# Patient Record
Sex: Female | Born: 1975 | Race: White | Hispanic: No | Marital: Single | Smoking: Current every day smoker
Health system: Southern US, Community
[De-identification: ages and names within clinical notes are randomized; demographics above are authoritative.]

## PROBLEM LIST (undated history)

## (undated) DIAGNOSIS — C50919 Malignant neoplasm of unspecified site of unspecified female breast: Secondary | ICD-10-CM

## (undated) DIAGNOSIS — M549 Dorsalgia, unspecified: Secondary | ICD-10-CM

## (undated) DIAGNOSIS — G8929 Other chronic pain: Secondary | ICD-10-CM

## (undated) HISTORY — PX: TONSILLECTOMY AND ADENOIDECTOMY: SHX28

## (undated) HISTORY — PX: MASTECTOMY: SHX3

---

## 2018-01-01 ENCOUNTER — Emergency Department (INDEPENDENT_AMBULATORY_CARE_PROVIDER_SITE_OTHER)
Admission: EM | Admit: 2018-01-01 | Discharge: 2018-01-01 | Disposition: A | Discharge status: Home / Self Care | Payer: Medicaid Other | Source: Home / Self Care | Attending: Family Medicine | Admitting: Family Medicine

## 2018-01-01 ENCOUNTER — Other Ambulatory Visit: Payer: Self-pay

## 2018-01-01 ENCOUNTER — Encounter: Payer: Self-pay | Admitting: *Deleted

## 2018-01-01 DIAGNOSIS — H6691 Otitis media, unspecified, right ear: Secondary | ICD-10-CM | POA: Diagnosis not present

## 2018-01-01 HISTORY — DX: Dorsalgia, unspecified: M54.9

## 2018-01-01 HISTORY — DX: Other chronic pain: G89.29

## 2018-01-01 MED ORDER — AMOXICILLIN-POT CLAVULANATE 875-125 MG PO TABS: 1.0000 | ORAL_TABLET | Freq: Two times a day (BID) | ORAL | 0 refills | Status: AC

## 2018-01-01 NOTE — Discharge Instructions (Signed)
° °  Please take antibiotics as prescribed and be sure to complete entire course even if you start to feel better to ensure infection does not come back.  Please call to schedule a follow up appointment with an ear nose and throat (ENT) specialist for recheck of Right ear symptoms next week.

## 2018-01-01 NOTE — ED Triage Notes (Signed)
Patient c/o chronic right ear infection with drainage. Also c/o HA and low grade fever last night. Reports seeing an ENT "years ago".

## 2018-01-01 NOTE — ED Provider Notes (Signed)
[name]    CSN: [id number] Arrival date & time: 01/01/18  0955     History   Chief Complaint Chief Complaint  Patient presents with  . Otalgia  . Headache    HPI Jaclyn Taylor is a 42 y.o. female.   HPI Jaclyn Taylor is a 42 y.o. female presenting to UC with c/o 2 months of waxing and waning Right ear pain that is aching and throbbing, associated brown discharge and Right sided headache. She has had recurrent Right ear infections for the last 5 years. Mild nasal congestion and cough from post-nasal drip.  She has not f/u with her PCP or ENT for current problem. She did have chills and a temp of 101*F last night.    Past Medical History:  Diagnosis Date  . Chronic back pain     There are no active problems to display for this patient.   Past Surgical History:  Procedure Laterality Date  . TONSILLECTOMY AND ADENOIDECTOMY      OB History   None      Home Medications    Prior to Admission medications   Medication Sig Start Date End Date Taking? Authorizing Provider  OXYCODONE-ACETAMINOPHEN PO Take 10 mg by mouth.   Yes [provider]  amoxicillin-clavulanate (AUGMENTIN) 875-125 MG tablet Take 1 tablet by mouth 2 (two) times daily for 10 days. 01/01/18 01/11/18  Noe Gens, PA-C    Family History Family History  Problem Relation Age of Onset  . Cancer Mother        breast and lung CA    Social History Social History   Tobacco Use  . Smoking status: Current Every Day Smoker  . Smokeless tobacco: Never Used  Substance Use Topics  . Alcohol use: Yes  . Drug use: Never     Allergies   Aleve [naproxen sodium]   Review of Systems Review of Systems  Constitutional: Positive for chills and fever.  HENT: Positive for congestion, ear discharge, ear pain (Right), postnasal drip and sinus pressure. Negative for rhinorrhea, sinus pain and sore throat.   Respiratory: Positive for cough. Negative for shortness of breath and  wheezing.   Gastrointestinal: Negative for diarrhea, nausea and vomiting.  Musculoskeletal: Negative for neck pain and neck stiffness.  Neurological: Positive for headaches. Negative for dizziness and light-headedness.     Physical Exam Triage Vital Signs ED Triage Vitals [01/01/18 1020]  Enc Vitals Group     BP 121/87     Pulse Rate (!) 108     Resp 14     Temp 98.7 F (37.1 C)     Temp Source Oral     SpO2 99 %     Weight 149 lb (67.6 kg)     Height      Head Circumference      Peak Flow      Pain Score 2     Pain Loc      Pain Edu?      Excl. in [place]?    No data found.  Updated Vital Signs BP 121/87 (BP Location: Right Arm)   Pulse (!) 108   Temp 98.7 F (37.1 C) (Oral)   Resp 14   Wt 149 lb (67.6 kg)   LMP 12/06/2017   SpO2 99%   Visual Acuity Right Eye Distance:   Left Eye Distance:   Bilateral Distance:    Right Eye Near:   Left Eye Near:    Bilateral Near:  Physical Exam  Constitutional: She is oriented to person, place, and time. She appears well-developed and well-nourished.  HENT:  Head: Normocephalic and atraumatic.  Right Ear: There is drainage (yellow/brown). Tympanic membrane is erythematous. Tympanic membrane is not bulging.  Left Ear: Tympanic membrane normal.  Nose: Right sinus exhibits maxillary sinus tenderness. Right sinus exhibits no frontal sinus tenderness. Left sinus exhibits no maxillary sinus tenderness and no frontal sinus tenderness.  Mouth/Throat: Uvula is midline, oropharynx is clear and moist and mucous membranes are normal.  Eyes: EOM are normal.  Neck: Normal range of motion. Neck supple.  Cardiovascular: Normal rate and regular rhythm.  Pulmonary/Chest: Effort normal and breath sounds normal. No respiratory distress. She has no wheezes.  Musculoskeletal: Normal range of motion.  Neurological: She is alert and oriented to person, place, and time.  Skin: Skin is warm and dry.  Psychiatric: She has a normal mood and  affect. Her behavior is normal.  Nursing note and vitals reviewed.    UC Treatments / Results  Labs (all labs ordered are listed, but only abnormal results are displayed) Labs Reviewed - No data to display  EKG None  Radiology No results found.  Procedures Procedures (including critical care time)  Medications Ordered in UC Medications - No data to display  Initial Impression / Assessment and Plan / UC Course  I have reviewed the triage vital signs and the nursing notes.  Pertinent labs & imaging results that were available during my care of the patient were reviewed by me and considered in my medical decision making (see chart for details).    Hx and exam c/w Right AOM Will start on 10 days of Augmentin Home care instructions and resource guide provided.  Final Clinical Impressions(s) / UC Diagnoses   Final diagnoses:  Recurrent acute otitis media of right ear     Discharge Instructions       Please take antibiotics as prescribed and be sure to complete entire course even if you start to feel better to ensure infection does not come back.  Please call to schedule a follow up appointment with an ear nose and throat (ENT) specialist for recheck of Right ear symptoms next week.     ED Prescriptions    Medication Sig Dispense Auth. Provider   amoxicillin-clavulanate (AUGMENTIN) 875-125 MG tablet Take 1 tablet by mouth 2 (two) times daily for 10 days. 20 tablet Noe Gens, PA-C     Controlled Substance Prescriptions [place] Controlled Substance Registry consulted? Not Applicable   Tyrell Antonio 01/01/18 1034

## 2018-04-20 ENCOUNTER — Other Ambulatory Visit: Payer: Self-pay

## 2018-04-20 ENCOUNTER — Emergency Department
Admission: EM | Admit: 2018-04-20 | Discharge: 2018-04-20 | Disposition: A | Discharge status: Home / Self Care | Payer: Medicaid Other | Source: Home / Self Care | Attending: Emergency Medicine | Admitting: Emergency Medicine

## 2018-04-20 DIAGNOSIS — H66014 Acute suppurative otitis media with spontaneous rupture of ear drum, recurrent, right ear: Secondary | ICD-10-CM

## 2018-04-20 LAB — POCT CBC W AUTO DIFF (K'VILLE URGENT CARE)

## 2018-04-20 MED ORDER — AMOXICILLIN-POT CLAVULANATE 875-125 MG PO TABS: 1.0000 | ORAL_TABLET | Freq: Two times a day (BID) | ORAL | 0 refills | Status: DC

## 2018-04-20 NOTE — ED Provider Notes (Signed)
[name]    CSN: [id number] Arrival date & time: 04/20/18  1038     History   Chief Complaint Chief Complaint  Patient presents with  . Otalgia    RT ear    HPI Jaclyn Taylor is a 43 y.o. female.  Patient presents with a throbbing pain in her right ear.  She has had a history of recurrent otitis media.  She was seen here 01/01/2018 and treated with Augmentin.  She has not seen an ear nose and throat specialist but has an appointment this week Thursday with [facility].  She was last treated with Augmentin and did have relief with that treatment program.  Her hearing is severely diminished in her right ear.  She has noticed swelling over the right mastoid area associated with a parietal type headache.  She states she has had a low-grade fever with this illness.  She also feels congested. HPI  Past Medical History:  Diagnosis Date  . Chronic back pain     There are no active problems to display for this patient.   Past Surgical History:  Procedure Laterality Date  . TONSILLECTOMY AND ADENOIDECTOMY      OB History   No obstetric history on file.      Home Medications    Prior to Admission medications   Medication Sig Start Date End Date Taking? Authorizing Provider  amoxicillin-clavulanate (AUGMENTIN) 875-125 MG tablet Take 1 tablet by mouth every 12 (twelve) hours. 04/20/18   Darlyne Russian, MD  OXYCODONE-ACETAMINOPHEN PO Take 10 mg by mouth.    [provider]    Family History Family History  Problem Relation Age of Onset  . Cancer Mother        breast and lung CA    Social History Social History   Tobacco Use  . Smoking status: Current Every Day Smoker  . Smokeless tobacco: Never Used  Substance Use Topics  . Alcohol use: Yes  . Drug use: Never     Allergies   Aleve [naproxen sodium] and Doxycycline   Review of Systems Review of Systems  Constitutional: Positive for fever.  HENT: Positive for congestion, ear discharge  and ear pain. Negative for sore throat.   Eyes: Negative.   Respiratory: Negative.   Cardiovascular: Negative.   Psychiatric/Behavioral: Negative.      Physical Exam Triage Vital Signs ED Triage Vitals  Enc Vitals Group     BP 04/20/18 1110 125/83     Pulse Rate 04/20/18 1110 89     Resp 04/20/18 1110 16     Temp 04/20/18 1110 98 F (36.7 C)     Temp Source 04/20/18 1110 Oral     SpO2 04/20/18 1110 100 %     Weight 04/20/18 1111 149 lb (67.6 kg)     Height 04/20/18 1111 5\' 2"  (1.575 m)     Head Circumference --      Peak Flow --      Pain Score 04/20/18 1110 8     Pain Loc --      Pain Edu? --      Excl. in [place]? --    No data found.  Updated Vital Signs BP 125/83 (BP Location: Right Arm)   Pulse 89   Temp 98 F (36.7 C) (Oral)   Resp 16   Ht 5\' 2"  (1.575 m)   Wt 67.6 kg   LMP  (LMP Unknown)   SpO2 100%   BMI 27.25 kg/m  Visual Acuity Right Eye Distance:   Left Eye Distance:   Bilateral Distance:    Right Eye Near:   Left Eye Near:    Bilateral Near:     Physical Exam Constitutional:      Appearance: Normal appearance.  HENT:     Head: Normocephalic.     Ears:     Comments: The left TM is normal the right TM is severely distorted with what appears to be a cholesteatoma.  There is a moist appearing exudate around the drum.  There is tenderness over the right mastoid and a 1 x 1 cm node over the mastoid.  The ear canal appears normal. Neck:     Musculoskeletal: Normal range of motion and neck supple.     Comments: There appears to be a right occipital node. Cardiovascular:     Rate and Rhythm: Normal rate and regular rhythm.  Pulmonary:     Effort: Pulmonary effort is normal.     Breath sounds: Normal breath sounds.  Neurological:     Mental Status: She is alert.    White blood count 10,400 77% granulocytes. Hemoglobin 14.3. Platelet count 241,000.  UC Treatments / Results  Labs (all labs ordered are listed, but only abnormal results are  displayed) Labs Reviewed  POCT CBC W AUTO DIFF ([place])    EKG None  Radiology No results found.  Procedures Procedures (including critical care time)  Medications Ordered in UC Medications - No data to display  Initial Impression / Assessment and Plan / UC Course  I have reviewed the triage vital signs and the nursing notes.  Pertinent labs & imaging results that were available during my care of the patient were reviewed by me and considered in my medical decision making (see chart for details). Patient has a purulent right otitis media.  I suspect there is a perforation that I could not see.  It looks like she may have cholesteatoma formation.  There is tenderness present over the mastoid area.  She is afebrile here today.  She does have an ENT appointment on Thursday.  She has responded well to Augmentin in the past and will go ahead and refill this medication and help her until she is able to be seen by ENT.  We discussed the possibility that infection could enter the mastoid sinus and she is aware of this being a significant infection.     Final Clinical Impressions(s) / UC Diagnoses   Final diagnoses:  Recurrent acute suppurative otitis media of right ear with spontaneous rupture of tympanic membrane     Discharge Instructions     Take antibiotics as instructed. You will need to use the pain medication you have at home for your back to use for pain. Be sure you keep your follow-up appointment with the ear nose and throat doctor. I am concerned you may have a cholesteatoma developing and it is very important that this be evaluated by the ear nose and throat physician.    ED Prescriptions    Medication Sig Dispense Auth. Provider   amoxicillin-clavulanate (AUGMENTIN) 875-125 MG tablet Take 1 tablet by mouth every 12 (twelve) hours. 14 tablet Darlyne Russian, MD     Controlled Substance Prescriptions [place] Controlled Substance Registry consulted? Yes, I have  consulted the [place] Controlled Substances Registry for this patient, and feel the risk/benefit ratio today is favorable for proceeding with this prescription for a controlled substance.   Darlyne Russian, MD 04/20/18 [phone]

## 2018-04-20 NOTE — Discharge Instructions (Signed)
Take antibiotics as instructed. You will need to use the pain medication you have at home for your back to use for pain. Be sure you keep your follow-up appointment with the ear nose and throat doctor. I am concerned you may have a cholesteatoma developing and it is very important that this be evaluated by the ear nose and throat physician.

## 2018-04-20 NOTE — ED Triage Notes (Signed)
Pt c/o RT ear pain. Was seen here a few months ago for same thing. Has appt with ENT next Thurs but can't wait. Pain 8/10.

## 2019-02-27 ENCOUNTER — Other Ambulatory Visit: Payer: Self-pay

## 2019-02-27 ENCOUNTER — Encounter: Payer: Self-pay | Admitting: Family Medicine

## 2019-02-27 ENCOUNTER — Emergency Department
Admission: EM | Admit: 2019-02-27 | Discharge: 2019-02-27 | Disposition: A | Discharge status: Home / Self Care | Payer: Medicaid Other | Source: Home / Self Care

## 2019-02-27 DIAGNOSIS — Z9189 Other specified personal risk factors, not elsewhere classified: Secondary | ICD-10-CM

## 2019-02-27 DIAGNOSIS — R519 Headache, unspecified: Secondary | ICD-10-CM

## 2019-02-27 MED ORDER — PREDNISONE 20 MG PO TABS: ORAL_TABLET | ORAL | 1 refills | Status: DC

## 2019-02-27 NOTE — ED Provider Notes (Signed)
[name]    CSN: [id number] Arrival date & time: 02/27/19  1123      History   Chief Complaint Chief Complaint  Patient presents with  . Headache    HPI Jaclyn Taylor is a 44 y.o. female.   [facility] patient.  Patient developed headache yesterday and has been on Goody powders for the last 24 hours.  She spiked a fever last night of 102.  She has had some intermittent epigastric pain, no significant cough, no loss of sense of smell, no diarrhea.     Past Medical History:  Diagnosis Date  . Chronic back pain     There are no problems to display for this patient.   Past Surgical History:  Procedure Laterality Date  . TONSILLECTOMY AND ADENOIDECTOMY      OB History   No obstetric history on file.      Home Medications    Prior to Admission medications   Medication Sig Start Date End Date Taking? Authorizing Provider  predniSONE (DELTASONE) 20 MG tablet 1 daily with food for headache 02/27/19   Robyn Haber, MD    Family History Family History  Problem Relation Age of Onset  . Cancer Mother        breast and lung CA  . Healthy Father     Social History Social History   Tobacco Use  . Smoking status: Current Every Day Smoker  . Smokeless tobacco: Never Used  Substance Use Topics  . Alcohol use: Yes  . Drug use: Never     Allergies   Aleve [naproxen sodium] and Doxycycline   Review of Systems Review of Systems  Constitutional: Positive for fatigue and fever.  Gastrointestinal: Positive for abdominal pain.  Musculoskeletal: Positive for myalgias.  Neurological: Positive for headaches.  All other systems reviewed and are negative.    Physical Exam Triage Vital Signs ED Triage Vitals  Enc Vitals Group     BP      Pulse      Resp      Temp      Temp src      SpO2      Weight      Height      Head Circumference      Peak Flow      Pain Score      Pain Loc      Pain Edu?      Excl. in [place]?    No data  found.  Updated Vital Signs BP 138/77 (BP Location: Right Arm)   Pulse (!) 104   Temp 98.2 F (36.8 C) (Oral)   Ht 5\' 2"  (1.575 m)   Wt 58.1 kg   SpO2 100%   BMI 23.41 kg/m    Physical Exam Vitals and nursing note reviewed.  Constitutional:      Appearance: She is well-developed and normal weight.  HENT:     Head: Normocephalic.  Eyes:     Extraocular Movements: Extraocular movements intact.  Cardiovascular:     Rate and Rhythm: Normal rate.  Pulmonary:     Effort: Pulmonary effort is normal.     Breath sounds: Normal breath sounds.  Abdominal:     General: Bowel sounds are normal. There is no distension.     Palpations: Abdomen is soft. There is no mass.     Tenderness: There is no abdominal tenderness.  Musculoskeletal:        General: Normal range of motion.  Cervical back: Normal range of motion and neck supple.  Skin:    General: Skin is warm and dry.  Neurological:     Mental Status: She is alert.     Cranial Nerves: No cranial nerve deficit or facial asymmetry.  Psychiatric:        Mood and Affect: Mood normal. Mood is not anxious.        Speech: Speech normal.        Behavior: Behavior normal.      UC Treatments / Results  Labs (all labs ordered are listed, but only abnormal results are displayed) Labs Reviewed  POC SARS CORONAVIRUS 2 AG -  ED    EKG   Radiology No results found.  Procedures Procedures (including critical care time)  Medications Ordered in UC Medications - No data to display  Initial Impression / Assessment and Plan / UC Course  I have reviewed the triage vital signs and the nursing notes.  Pertinent labs & imaging results that were available during my care of the patient were reviewed by me and considered in my medical decision making (see chart for details).     Final Clinical Impressions(s) / UC Diagnoses   Final diagnoses:  Acute intractable headache, unspecified headache type  At increased risk of exposure  to COVID-19 virus     Discharge Instructions     Strategies to prevent/treat Covid:  Vitamin D3 5000 IU (125 mg) daily Vitamin C 500 mg twice daily Zinc 50 to 75 mg daily  Listerine type mouthwash 4 times a day       ED Prescriptions    Medication Sig Dispense Auth. Provider   predniSONE (DELTASONE) 20 MG tablet 1 daily with food for headache 2 tablet Robyn Haber, MD     I have reviewed the PDMP during this encounter.   Robyn Haber, MD 02/27/19 [phone]

## 2019-02-27 NOTE — ED Triage Notes (Signed)
Headache, fever 102.2, chills, cough x 2 days, fatigue

## 2019-02-27 NOTE — Discharge Instructions (Addendum)
Strategies to prevent/treat Covid:  Vitamin D3 5000 IU (125 mg) daily Vitamin C 500 mg twice daily Zinc 50 to 75 mg daily  Listerine type mouthwash 4 times a day

## 2019-03-01 LAB — NOVEL CORONAVIRUS, NAA: SARS-CoV-2, NAA: NOT DETECTED

## 2019-06-27 ENCOUNTER — Emergency Department
Admission: EM | Admit: 2019-06-27 | Discharge: 2019-06-27 | Disposition: A | Discharge status: Home / Self Care | Payer: Medicaid Other | Source: Home / Self Care | Attending: Family Medicine | Admitting: Family Medicine

## 2019-06-27 ENCOUNTER — Other Ambulatory Visit: Payer: Self-pay

## 2019-06-27 ENCOUNTER — Encounter: Payer: Self-pay | Admitting: Emergency Medicine

## 2019-06-27 ENCOUNTER — Emergency Department (INDEPENDENT_AMBULATORY_CARE_PROVIDER_SITE_OTHER): Payer: Medicaid Other

## 2019-06-27 DIAGNOSIS — M4126 Other idiopathic scoliosis, lumbar region: Secondary | ICD-10-CM | POA: Diagnosis not present

## 2019-06-27 DIAGNOSIS — M545 Low back pain, unspecified: Secondary | ICD-10-CM

## 2019-06-27 DIAGNOSIS — M5136 Other intervertebral disc degeneration, lumbar region: Secondary | ICD-10-CM

## 2019-06-27 MED ORDER — KETOROLAC TROMETHAMINE 60 MG/2ML IM SOLN
60.0000 mg | Freq: Once | INTRAMUSCULAR | Status: AC
  Administered 2019-06-27: 60 mg via INTRAMUSCULAR

## 2019-06-27 MED ORDER — PREDNISONE 20 MG PO TABS: ORAL_TABLET | ORAL | 0 refills | Status: DC

## 2019-06-27 NOTE — ED Triage Notes (Signed)
Hx of chronic back pain - started yesterday w/ R hip pain  Woke up this am w/ lower back pain in the middle Difficult to stand upright  Drove self to hospital Advil last night - min relief No meds this am -has not eaten today Last visit was to [facility] for pain management and PT in Sept 2020

## 2019-06-27 NOTE — ED Provider Notes (Signed)
[name]    CSN: [id number] Arrival date & time: 06/27/19  1217      History   Chief Complaint Chief Complaint  Patient presents with  . Back Pain    lower    HPI Jaclyn Taylor is a 44 y.o. female.   Patient has a history of chronic lower back pain that has been recently quiescent.  She has been treated in the past by the [facility]. Yesterday she developed aching in her right hip area, and today awoke with more severe non-radiating pain in her lower middle and right back.  She recalls no injury or change in activities.  She denies bowel or bladder dysfunction, and no saddle numbness. Her pain is increased with standing, and improves when leaning forward.  Review of previous records reveals an LS spine x-ray done by [facility] 04/23/2011 that showed mild convex left scoliosis, retrolisthesis of L2 on L3, and disc space narrowing L2-3.  The history is provided by the patient.  Back Pain Location:  Lumbar spine and sacro-iliac joint Quality:  Aching Radiates to:  Does not radiate Pain severity:  Severe Pain is:  Same all the time Onset quality:  Sudden Duration:  1 day Timing:  Constant Progression:  Worsening Chronicity:  Recurrent Context: not lifting heavy objects and not recent injury   Relieved by:  Nothing Worsened by:  Movement and ambulation Ineffective treatments: warm packs. Associated symptoms: no abdominal pain, no abdominal swelling, no bladder incontinence, no bowel incontinence, no chest pain, no dysuria, no fever, no leg pain, no numbness, no paresthesias, no pelvic pain, no perianal numbness, no tingling and no weakness     Past Medical History:  Diagnosis Date  . Chronic back pain     There are no problems to display for this patient.   Past Surgical History:  Procedure Laterality Date  . TONSILLECTOMY AND ADENOIDECTOMY      OB History   No obstetric history on file.      Home Medications    Prior to Admission  medications   Medication Sig Start Date End Date Taking? Authorizing Provider  predniSONE (DELTASONE) 20 MG tablet Take one tab by mouth twice daily for 4 days, then one daily. Take with food. 06/27/19   Kandra Nicolas, MD    Family History Family History  Problem Relation Age of Onset  . Cancer Mother        breast and lung CA  . Healthy Father     Social History Social History   Tobacco Use  . Smoking status: Current Every Day Smoker  . Smokeless tobacco: Never Used  Substance Use Topics  . Alcohol use: Yes  . Drug use: Never     Allergies   Tizanidine, Aleve [naproxen sodium], Doxycycline, and Acetaminophen   Review of Systems Review of Systems  Constitutional: Negative for chills, diaphoresis, fatigue and fever.  Cardiovascular: Negative for chest pain.  Gastrointestinal: Negative for abdominal pain and bowel incontinence.  Genitourinary: Negative for bladder incontinence, dysuria and pelvic pain.  Musculoskeletal: Positive for back pain.  Skin: Negative for rash.  Neurological: Negative for tingling, weakness, numbness and paresthesias.  All other systems reviewed and are negative.    Physical Exam Triage Vital Signs ED Triage Vitals  Enc Vitals Group     BP 06/27/19 1232 (!) 143/83     Pulse Rate 06/27/19 1232 (!) 101     Resp 06/27/19 1232 20     Temp 06/27/19 1232  98.2 F (36.8 C)     Temp Source 06/27/19 1232 Oral     SpO2 06/27/19 1232 100 %     Weight 06/27/19 1234 137 lb (62.1 kg)     Height 06/27/19 1234 5\' 2"  (1.575 m)     Head Circumference --      Peak Flow --      Pain Score 06/27/19 1233 9     Pain Loc --      Pain Edu? --      Excl. in [place]? --    No data found.  Updated Vital Signs BP (!) 143/83 (BP Location: Right Arm)   Pulse (!) 101   Temp 98.2 F (36.8 C) (Oral)   Resp 20   Ht 5\' 2"  (1.575 m)   Wt 62.1 kg   LMP 06/05/2019 (Exact Date)   SpO2 100%   BMI 25.06 kg/m   Visual Acuity Right Eye Distance:   Left Eye  Distance:   Bilateral Distance:    Right Eye Near:   Left Eye Near:    Bilateral Near:     Physical Exam Vitals and nursing note reviewed.  Constitutional:      General: She is not in acute distress.    Comments: Patient is uncomfortable with any movement.  Eyes:     Pupils: Pupils are equal, round, and reactive to light.  Cardiovascular:     Rate and Rhythm: Tachycardia present.     Heart sounds: Normal heart sounds.  Pulmonary:     Breath sounds: Normal breath sounds.  Abdominal:     General: Abdomen is flat.     Palpations: Abdomen is soft.     Tenderness: There is no abdominal tenderness.  Musculoskeletal:     Cervical back: Normal range of motion.     Lumbar back: Tenderness present. No swelling. Decreased range of motion. Positive left straight leg raise test. Negative right straight leg raise test.       Back:     Comments: Back:  Range of motion is decreased.  Walking appears uncomfortable for patient.  She is unable to squat and heel/toe walk. Tenderness in the midline and bilateral paraspinous muscles from L3 to Sacral area, worse on the right.  Straight leg raising test is positive on the right.  Sitting knee extension test is positive on the right.  Strength and sensation in the lower extremities is normal.  Patellar and achilles reflexes are hyperreflexic.   Skin:    General: Skin is warm and dry.     Findings: No rash.  Neurological:     Mental Status: She is alert.      UC Treatments / Results  Labs (all labs ordered are listed, but only abnormal results are displayed) Labs Reviewed - No data to display  EKG   Radiology DG Lumbar Spine Complete  Result Date: 06/27/2019 CLINICAL DATA:  Chronic low back pain.  Pain is suddenly worse. EXAM: LUMBAR SPINE - COMPLETE 4+ VIEW COMPARISON:  None. FINDINGS: Mild rotary levoscoliosis in the lumbar spine at L2-L3. Mild retrolisthesis at L2-L3 and L3-L4. Mild disc space narrowing at L5-S1. The vertebral body heights  are maintained. Degenerate endplate changes at 624THL. Normal appearance of the SI joints. IMPRESSION: Mild scoliosis with degenerative changes in lumbar spine. Electronically Signed   By: Markus Daft M.D.   On: 06/27/2019 13:58    Procedures Procedures (including critical care time)  Medications Ordered in UC Medications  ketorolac (TORADOL) injection 60 mg (  60 mg Intramuscular Given 06/27/19 1320)    Initial Impression / Assessment and Plan / UC Course  I have reviewed the triage vital signs and the nursing notes.  Pertinent labs & imaging results that were available during my care of the patient were reviewed by me and considered in my medical decision making (see chart for details).    Administered Toradol 60mg  IM  Begin prednisone burst/taper. Followup with Dr. Aundria Mems ([facility]) if not improving about two weeks.    Final Clinical Impressions(s) / UC Diagnoses   Final diagnoses:  Degenerative disc disease, lumbar  Acute right-sided low back pain without sciatica     Discharge Instructions     Apply ice pack for 20 to 30 minutes, 3 to 4 times daily  Continue until pain decreases.  Begin back range of motion and stretching exercises as tolerated.    ED Prescriptions    Medication Sig Dispense Auth. Provider   predniSONE (DELTASONE) 20 MG tablet Take one tab by mouth twice daily for 4 days, then one daily. Take with food. 12 tablet Kandra Nicolas, MD        Kandra Nicolas, MD 06/27/19 [phone]

## 2019-06-27 NOTE — Discharge Instructions (Addendum)
Apply ice pack for 20 to 30 minutes, 3 to 4 times daily  Continue until pain decreases.  Begin back range of motion and stretching exercises as tolerated.

## 2020-01-02 ENCOUNTER — Other Ambulatory Visit: Payer: Self-pay

## 2020-01-02 ENCOUNTER — Emergency Department (INDEPENDENT_AMBULATORY_CARE_PROVIDER_SITE_OTHER)
Admission: RE | Admit: 2020-01-02 | Discharge: 2020-01-02 | Disposition: A | Discharge status: Home / Self Care | Payer: Medicaid Other | Source: Ambulatory Visit

## 2020-01-02 VITALS — BP 127/82 | HR 100 | Temp 98.7°F | Resp 18

## 2020-01-02 DIAGNOSIS — J069 Acute upper respiratory infection, unspecified: Secondary | ICD-10-CM | POA: Diagnosis not present

## 2020-01-02 DIAGNOSIS — J019 Acute sinusitis, unspecified: Secondary | ICD-10-CM

## 2020-01-02 DIAGNOSIS — B9689 Other specified bacterial agents as the cause of diseases classified elsewhere: Secondary | ICD-10-CM

## 2020-01-02 DIAGNOSIS — J209 Acute bronchitis, unspecified: Secondary | ICD-10-CM

## 2020-01-02 LAB — POC SARS CORONAVIRUS 2 AG -  ED: SARS Coronavirus 2 Ag: NEGATIVE

## 2020-01-02 MED ORDER — IPRATROPIUM BROMIDE 0.06 % NA SOLN: 2.0000 | Freq: Four times a day (QID) | NASAL | 1 refills | Status: DC

## 2020-01-02 MED ORDER — ALBUTEROL SULFATE HFA 108 (90 BASE) MCG/ACT IN AERS: 1.0000 | INHALATION_SPRAY | Freq: Four times a day (QID) | RESPIRATORY_TRACT | 0 refills | Status: DC | PRN

## 2020-01-02 MED ORDER — AMOXICILLIN-POT CLAVULANATE 875-125 MG PO TABS: 1.0000 | ORAL_TABLET | Freq: Two times a day (BID) | ORAL | 0 refills | Status: DC

## 2020-01-02 MED ORDER — PREDNISONE 20 MG PO TABS: ORAL_TABLET | ORAL | 0 refills | Status: DC

## 2020-01-02 NOTE — ED Triage Notes (Signed)
Pt c/o runny nose since last weekend. Neg covid test on Monday. Sxs worsening over the week. Chest and head congestion/headache. Fever 101.2 last night. Mucinex cold and flu this am. No known covid exposure. Has not had covid vaccinations.

## 2020-01-02 NOTE — Discharge Instructions (Signed)

## 2020-01-02 NOTE — ED Provider Notes (Signed)
[name]    CSN: [id number] Arrival date & time: 01/02/20  1145      History   Chief Complaint Chief Complaint  Patient presents with  . Cough    Appt 12pm  . Nasal Congestion  . Headache    HPI Jaclyn Taylor is a 44 y.o. female.   HPI  Jaclyn Taylor is a 44 y.o. female presenting to UC with c/o runny nose that started last week. She had a negative COVID test 3 days ago but has since developed worsening cough, congestion, sore throat, generalized HA, decreased taste and smell and temp of 101.2*F last night. She has been taking Mucinex cold and flu, last dose this morning. No known COVID exposure. She has not received the COVID vaccine.  Hx of sinusitis and pneumonia in the past. She has to be hospitalized for pneumonia. No hx of asthma.    Past Medical History:  Diagnosis Date  . Chronic back pain     There are no problems to display for this patient.   Past Surgical History:  Procedure Laterality Date  . TONSILLECTOMY AND ADENOIDECTOMY      OB History   No obstetric history on file.      Home Medications    Prior to Admission medications   Medication Sig Start Date End Date Taking? Authorizing Provider  albuterol (VENTOLIN HFA) 108 (90 Base) MCG/ACT inhaler Inhale 1-2 puffs into the lungs every 6 (six) hours as needed for wheezing or shortness of breath. 01/02/20   Noe Gens, PA-C  amoxicillin-clavulanate (AUGMENTIN) 875-125 MG tablet Take 1 tablet by mouth 2 (two) times daily. One po bid x 7 days 01/02/20   Noe Gens, PA-C  ipratropium (ATROVENT) 0.06 % nasal spray Place 2 sprays into both nostrils 4 (four) times daily. 01/02/20   Noe Gens, PA-C  predniSONE (DELTASONE) 20 MG tablet 3 tabs po day one, then 2 po daily x 4 days 01/02/20   Noe Gens, PA-C    Family History Family History  Problem Relation Age of Onset  . Cancer Mother        breast and lung CA  . Healthy Father     Social History Social History    Tobacco Use  . Smoking status: Current Every Day Smoker  . Smokeless tobacco: Never Used  Vaping Use  . Vaping Use: Never used  Substance Use Topics  . Alcohol use: Yes  . Drug use: Never     Allergies   Tizanidine, Aleve [naproxen sodium], Doxycycline, and Acetaminophen   Review of Systems Review of Systems  Constitutional: Positive for fever. Negative for chills.  HENT: Positive for congestion, sinus pressure and sore throat. Negative for ear pain, trouble swallowing and voice change.   Respiratory: Positive for cough. Negative for shortness of breath.   Cardiovascular: Negative for chest pain and palpitations.  Gastrointestinal: Negative for abdominal pain, diarrhea, nausea and vomiting.  Musculoskeletal: Negative for arthralgias, back pain and myalgias.  Skin: Negative for rash.  Neurological: Positive for headaches. Negative for dizziness and light-headedness.  All other systems reviewed and are negative.    Physical Exam Triage Vital Signs ED Triage Vitals  Enc Vitals Group     BP 01/02/20 1155 127/82     Pulse Rate 01/02/20 1155 100     Resp 01/02/20 1155 18     Temp 01/02/20 1155 98.7 F (37.1 C)     Temp Source 01/02/20 1155 Oral  SpO2 01/02/20 1155 100 %     Weight --      Height --      Head Circumference --      Peak Flow --      Pain Score 01/02/20 1156 0     Pain Loc --      Pain Edu? --      Excl. in [place]? --    No data found.  Updated Vital Signs BP 127/82 (BP Location: Right Arm)   Pulse 100   Temp 98.7 F (37.1 C) (Oral)   Resp 18   LMP  (LMP Unknown)   SpO2 100%   Visual Acuity Right Eye Distance:   Left Eye Distance:   Bilateral Distance:    Right Eye Near:   Left Eye Near:    Bilateral Near:     Physical Exam Vitals and nursing note reviewed.  Constitutional:      General: She is not in acute distress.    Appearance: She is well-developed. She is not ill-appearing, toxic-appearing or diaphoretic.  HENT:     Head:  Normocephalic and atraumatic.     Right Ear: Tympanic membrane and ear canal normal.     Left Ear: Tympanic membrane and ear canal normal.     Nose:     Right Sinus: Maxillary sinus tenderness and frontal sinus tenderness present.     Left Sinus: Maxillary sinus tenderness and frontal sinus tenderness present.     Mouth/Throat:     Lips: Pink.     Mouth: Mucous membranes are moist.     Pharynx: Oropharynx is clear. Uvula midline.  Cardiovascular:     Rate and Rhythm: Normal rate and regular rhythm.  Pulmonary:     Effort: Pulmonary effort is normal. No respiratory distress.     Breath sounds: No stridor. Wheezing and rhonchi present. No rales.  Musculoskeletal:        General: Normal range of motion.     Cervical back: Normal range of motion and neck supple. No rigidity.  Lymphadenopathy:     Cervical: No cervical adenopathy.  Skin:    General: Skin is warm and dry.  Neurological:     Mental Status: She is alert and oriented to person, place, and time.  Psychiatric:        Behavior: Behavior normal.      UC Treatments / Results  Labs (all labs ordered are listed, but only abnormal results are displayed) Labs Reviewed  COVID-19, FLU A+B AND RSV  POC SARS CORONAVIRUS 2 AG -  ED    EKG   Radiology No results found.  Procedures Procedures (including critical care time)  Medications Ordered in UC Medications - No data to display  Initial Impression / Assessment and Plan / UC Course  I have reviewed the triage vital signs and the nursing notes.  Pertinent labs & imaging results that were available during my care of the patient were reviewed by me and considered in my medical decision making (see chart for details).     Hx and exam c/w sinusitis and bronchitis Discussed CXR, pt would like to try trial of antibiotics first, does not feel like she did when she needed to be hospitalized for pneumonia in the past.  Will tx with Augmentin, prednisone and  albuterol COVID/RSV/Flu test pending Discussed symptoms that warrant emergent care in the ED. AVS given  Final Clinical Impressions(s) / UC Diagnoses   Final diagnoses:  Acute upper respiratory infection  Acute  bronchitis, unspecified organism  Acute bacterial rhinosinusitis     Discharge Instructions      Please take antibiotics as prescribed and be sure to complete entire course even if you start to feel better to ensure infection does not come back.  You may take 500mg  acetaminophen every 4-6 hours or in combination with ibuprofen 400-600mg  every 6-8 hours as needed for pain, inflammation, and fever.  Be sure to well hydrated with clear liquids and get at least 8 hours of sleep at night, preferably more while sick.   Please follow up with family medicine in 1 week if needed.     ED Prescriptions    Medication Sig Dispense Auth. Provider   amoxicillin-clavulanate (AUGMENTIN) 875-125 MG tablet Take 1 tablet by mouth 2 (two) times daily. One po bid x 7 days 14 tablet Yana Schorr O, PA-C   predniSONE (DELTASONE) 20 MG tablet 3 tabs po day one, then 2 po daily x 4 days 11 tablet Haidy Kackley O, PA-C   albuterol (VENTOLIN HFA) 108 (90 Base) MCG/ACT inhaler Inhale 1-2 puffs into the lungs every 6 (six) hours as needed for wheezing or shortness of breath. 18 g Leeroy Cha O, PA-C   ipratropium (ATROVENT) 0.06 % nasal spray Place 2 sprays into both nostrils 4 (four) times daily. 15 mL Noe Gens, PA-C     PDMP not reviewed this encounter.   Noe Gens, [place] 01/02/20 1502

## 2020-01-03 ENCOUNTER — Telehealth: Payer: Self-pay | Admitting: Family Medicine

## 2020-01-03 MED ORDER — BENZONATATE 200 MG PO CAPS: 200.0000 mg | ORAL_CAPSULE | Freq: Three times a day (TID) | ORAL | 0 refills | Status: DC | PRN

## 2020-01-03 NOTE — Telephone Encounter (Signed)
Patient requests cough medication. Recommend Mucinex (plain guafenesin 1200mg ), one twice daily with plenty of fluids. Rx [employer]

## 2020-01-03 NOTE — Telephone Encounter (Signed)
Pt notified tessalon rx was sent in and also advised mucinex BID. F/u with PCP next week if not improving.

## 2020-01-07 ENCOUNTER — Telehealth: Payer: Self-pay

## 2020-01-07 NOTE — Telephone Encounter (Signed)
Pt called yesterday asking for lab results, had to call lab due to results not crossing over. Per lab, all results neg. Pt notified.

## 2020-02-22 DIAGNOSIS — U071 COVID-19: Secondary | ICD-10-CM

## 2020-02-22 HISTORY — DX: COVID-19: U07.1

## 2020-08-20 ENCOUNTER — Encounter: Payer: Self-pay | Admitting: Emergency Medicine

## 2020-08-20 ENCOUNTER — Emergency Department (INDEPENDENT_AMBULATORY_CARE_PROVIDER_SITE_OTHER)
Admission: EM | Admit: 2020-08-20 | Discharge: 2020-08-20 | Disposition: A | Discharge status: Home / Self Care | Payer: Medicaid Other | Source: Home / Self Care

## 2020-08-20 ENCOUNTER — Telehealth: Payer: Self-pay | Admitting: Emergency Medicine

## 2020-08-20 ENCOUNTER — Other Ambulatory Visit: Payer: Self-pay

## 2020-08-20 DIAGNOSIS — M7711 Lateral epicondylitis, right elbow: Secondary | ICD-10-CM

## 2020-08-20 DIAGNOSIS — M25521 Pain in right elbow: Secondary | ICD-10-CM

## 2020-08-20 HISTORY — DX: Malignant neoplasm of unspecified site of unspecified female breast: C50.919

## 2020-08-20 MED ORDER — METHYLPREDNISOLONE 4 MG PO TBPK: ORAL_TABLET | ORAL | 0 refills | Status: DC

## 2020-08-20 MED ORDER — METHYLPREDNISOLONE ACETATE 80 MG/ML IJ SUSP
80.0000 mg | Freq: Once | INTRAMUSCULAR | Status: AC
  Administered 2020-08-20: 80 mg via INTRAMUSCULAR

## 2020-08-20 NOTE — ED Triage Notes (Signed)
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

## 2020-08-20 NOTE — Telephone Encounter (Deleted)
Pt requested diflucan be sent to pharmacy - the last time she was on antibiotics, she had to take diflucan - Provider updated  Kathi Ludwig, FNP) - diflucan sent in

## 2020-08-20 NOTE — Telephone Encounter (Signed)
Error wrong chart

## 2020-08-20 NOTE — ED Provider Notes (Signed)
[name]    CSN: [id number] Arrival date & time: 08/20/20  4854      History   Chief Complaint Chief Complaint  Patient presents with   Elbow Pain    right    HPI Jaclyn Taylor is a 45 y.o. female.   HPI 45 year old female presents with right elbow pain for 2 weeks.  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  Reports taking OTC Tylenol, heat, ice, ibuprofen with minimal relief.  Patient is not vaccinated for COVID-19.  Past Medical History:  Diagnosis Date   Breast cancer ([place])    Chronic back pain    COVID-19 02/2020    There are no problems to display for this patient.   Past Surgical History:  Procedure Laterality Date   MASTECTOMY     TONSILLECTOMY AND ADENOIDECTOMY      OB History   No obstetric history on file.      Home Medications    Prior to Admission medications   Medication Sig Start Date End Date Taking? Authorizing Provider  methylPREDNISolone (MEDROL DOSEPAK) 4 MG TBPK tablet Take as directed 08/20/20  Yes Eliezer Lofts, FNP  albuterol (VENTOLIN HFA) 108 (90 Base) MCG/ACT inhaler Inhale 1-2 puffs into the lungs every 6 (six) hours as needed for wheezing or shortness of breath. Patient not taking: Reported on 08/20/2020 01/02/20   Noe Gens, PA-C  amoxicillin-clavulanate (AUGMENTIN) 875-125 MG tablet Take 1 tablet by mouth 2 (two) times daily. One po bid x 7 days Patient not taking: Reported on 08/20/2020 01/02/20   Noe Gens, PA-C  benzonatate (TESSALON) 200 MG capsule Take 1 capsule (200 mg total) by mouth 3 (three) times daily as needed for cough. Patient not taking: Reported on 08/20/2020 01/03/20   Kandra Nicolas, MD  ipratropium (ATROVENT) 0.06 % nasal spray Place 2 sprays into both nostrils 4 (four) times daily. Patient not taking: Reported on 08/20/2020 01/02/20   Noe Gens, PA-C  predniSONE (DELTASONE) 20 MG tablet 3 tabs po day one, then 2 po daily x 4  days Patient not taking: Reported on 08/20/2020 01/02/20   Noe Gens, PA-C    Family History Family History  Problem Relation Age of Onset   Cancer Mother        breast and lung CA   Healthy Father     Social History Social History   Tobacco Use   Smoking status: Every Day    Pack years: 0.00   Smokeless tobacco: Never  Vaping Use   Vaping Use: Never used  Substance Use Topics   Alcohol use: Not Currently   Drug use: Never     Allergies   Tizanidine, Aleve [naproxen sodium], Doxycycline, and Acetaminophen   Review of Systems Review of Systems  Musculoskeletal:        Right elbow pain x2 weeks    Physical Exam Triage Vital Signs ED Triage Vitals  Enc Vitals Group     BP 08/20/20 0953 132/88     Pulse Rate 08/20/20 0953 99     Resp 08/20/20 0953 17     Temp 08/20/20 0953 98.9 F (37.2 C)     Temp Source 08/20/20 0953 Oral     SpO2 08/20/20 0953 98 %     Weight --      Height --      Head Circumference --      Peak Flow --  Pain Score 08/20/20 0954 9     Pain Loc --      Pain Edu? --      Excl. in [place]? --    No data found.  Updated Vital Signs BP 132/88 (BP Location: Right Arm)   Pulse 99   Temp 98.9 F (37.2 C) (Oral)   Resp 17   LMP 08/14/2020 (Exact Date)   SpO2 98%    Physical Exam Vitals and nursing note reviewed.  Constitutional:      Appearance: Normal appearance. She is normal weight.  HENT:     Head: Normocephalic and atraumatic.     Mouth/Throat:     Mouth: Mucous membranes are moist.     Pharynx: Oropharynx is clear.  Eyes:     Extraocular Movements: Extraocular movements intact.     Conjunctiva/sclera: Conjunctivae normal.     Pupils: Pupils are equal, round, and reactive to light.  Cardiovascular:     Rate and Rhythm: Normal rate and regular rhythm.     Pulses: Normal pulses.     Heart sounds: Normal heart sounds.  Pulmonary:     Effort: Pulmonary effort is normal. No respiratory distress.     Breath sounds:  Normal breath sounds. No wheezing, rhonchi or rales.  Musculoskeletal:     Cervical back: Normal range of motion and neck supple.     Comments: Right elbow: TTP over superior/lateral aspect of olecranon, mild soft tissue swelling, LROM and pain elicited with flexion/extension, pronation/supination  Skin:    General: Skin is warm and dry.  Neurological:     General: No focal deficit present.     Mental Status: She is alert and oriented to person, place, and time.  Psychiatric:        Mood and Affect: Mood normal.        Behavior: Behavior normal.     UC Treatments / Results  Labs (all labs ordered are listed, but only abnormal results are displayed) Labs Reviewed - No data to display  EKG   Radiology No results found.  Procedures Procedures (including critical care time)  Medications Ordered in UC Medications  methylPREDNISolone acetate (DEPO-MEDROL) injection 80 mg (80 mg Intramuscular Given 08/20/20 1025)    Initial Impression / Assessment and Plan / UC Course  I have reviewed the triage vital signs and the nursing notes.  Pertinent labs & imaging results that were available during my care of the patient were reviewed by me and considered in my medical decision making (see chart for details).     MDM: 1.  Right elbow pain-Rx Medrol Dosepak, 2.  Lateral epicondylitis elbow-Depo-Medrol IM now, right arm band placed prior to discharge. Final Clinical Impressions(s) / UC Diagnoses   Final diagnoses:  Right elbow pain  Lateral epicondylitis, right elbow     Discharge Instructions      Advised/instructed patient not to start Medrol Dosepak until 130-2 PM today.  Advised patient to take medication as directed with food to completion.  Encourage patient increase daily water intake.  Advised patient to wear right arm band 24/7 for the next 2 weeks except when bathing.  Work note provided to patient and advised to avoid mild-moderately heavy lifting, repetitive/overuse  motions involving right elbow for the next 2 weeks.     ED Prescriptions     Medication Sig Dispense Auth. Provider   methylPREDNISolone (MEDROL DOSEPAK) 4 MG TBPK tablet Take as directed 1 each Eliezer Lofts, FNP      PDMP not  reviewed this encounter.   Eliezer Lofts, [place] 08/20/20 1046

## 2020-08-20 NOTE — Discharge Instructions (Addendum)
Advised/instructed patient not to start Medrol Dosepak until 130-2 PM today.  Advised patient to take medication as directed with food to completion.  Encourage patient increase daily water intake.  Advised patient to wear right arm band 24/7 for the next 2 weeks except when bathing.  Work note provided to patient and advised to avoid mild-moderately heavy lifting, repetitive/overuse motions involving right elbow for the next 2 weeks.

## 2020-09-14 ENCOUNTER — Other Ambulatory Visit: Payer: Self-pay

## 2020-09-14 ENCOUNTER — Encounter: Payer: Self-pay | Admitting: Emergency Medicine

## 2020-09-14 ENCOUNTER — Emergency Department (INDEPENDENT_AMBULATORY_CARE_PROVIDER_SITE_OTHER)
Admission: EM | Admit: 2020-09-14 | Discharge: 2020-09-14 | Disposition: A | Discharge status: Home / Self Care | Payer: Medicaid Other | Source: Home / Self Care

## 2020-09-14 DIAGNOSIS — M25521 Pain in right elbow: Secondary | ICD-10-CM

## 2020-09-14 DIAGNOSIS — M659 Synovitis and tenosynovitis, unspecified: Secondary | ICD-10-CM

## 2020-09-14 DIAGNOSIS — M65931 Unspecified synovitis and tenosynovitis, right forearm: Secondary | ICD-10-CM

## 2020-09-14 MED ORDER — PREDNISONE 20 MG PO TABS: ORAL_TABLET | ORAL | 0 refills | Status: DC

## 2020-09-14 NOTE — ED Triage Notes (Signed)
Patient here for recurrent right elbow pain.  Patient was seen here about a month ago, diagnosed with tennis elbow, wearing the band given to patient, hasn't helped a lot.  Patient states that pain is shooting up her arm.  Patient is taken Ibuprofen for pain and Meloxicam.

## 2020-09-14 NOTE — Discharge Instructions (Addendum)
Advised patient to take medication as directed with food to completion.  Encourage patient to wear right wrist brace 24/7 except when bathing) until being evaluated by orthopedic provider (contact information provided above).

## 2020-09-14 NOTE — ED Provider Notes (Signed)
Jaclyn Taylor CARE    CSN: [id number] Arrival date & time: 09/14/20  1107      History   Chief Complaint Chief Complaint  Patient presents with   Elbow Pain    HPI Jaclyn Taylor is a 45 y.o. female.   HPI 45 year old female presents with right elbow pain for 1 month diagnosed with tennis elbow/lateral epicondylitis (patient was evaluated by me for lateral epicondylitis prescribed Medrol dose pack and right elbow brace at that time) wearing armband given then which has not helped a lot.  Reports patient taking ibuprofen for pain with meloxicam.  Patient reports not following up with PCP and/or orthopedic provider since that evaluation  Past Medical History:  Diagnosis Date   Breast cancer ([place])    Chronic back pain    COVID-19 02/2020    There are no problems to display for this patient.   Past Surgical History:  Procedure Laterality Date   MASTECTOMY     TONSILLECTOMY AND ADENOIDECTOMY      OB History   No obstetric history on file.      Home Medications    Prior to Admission medications   Medication Sig Start Date End Date Taking? Authorizing Provider  predniSONE (DELTASONE) 20 MG tablet Take 3 tabs PO daily x 3 days, then 2 tabs PO daily x 3 days, then 1 tab PO daily x 3 days 09/14/20  Yes Jaclyn Lofts, FNP  albuterol (VENTOLIN HFA) 108 (90 Base) MCG/ACT inhaler Inhale 1-2 puffs into the lungs every 6 (six) hours as needed for wheezing or shortness of breath. Patient not taking: Reported on 08/20/2020 01/02/20   Jaclyn Gens, PA-C  amoxicillin-clavulanate (AUGMENTIN) 875-125 MG tablet Take 1 tablet by mouth 2 (two) times daily. One po bid x 7 days Patient not taking: Reported on 08/20/2020 01/02/20   Jaclyn Gens, PA-C  benzonatate (TESSALON) 200 MG capsule Take 1 capsule (200 mg total) by mouth 3 (three) times daily as needed for cough. Patient not taking: Reported on 08/20/2020 01/03/20   Jaclyn Nicolas, MD  ipratropium (ATROVENT) 0.06 % nasal spray  Place 2 sprays into both nostrils 4 (four) times daily. Patient not taking: Reported on 08/20/2020 01/02/20   Jaclyn Taylor    Family History Family History  Problem Relation Age of Onset   Cancer Mother        breast and lung CA   Healthy Father     Social History Social History   Tobacco Use   Smoking status: Every Day   Smokeless tobacco: Never  Vaping Use   Vaping Use: Never used  Substance Use Topics   Alcohol use: Not Currently   Drug use: Never     Allergies   Tizanidine, Aleve [naproxen sodium], Doxycycline, and Acetaminophen   Review of Systems Review of Systems  Musculoskeletal:        Left elbow pain, wrist pain x1 month    Physical Exam Triage Vital Signs ED Triage Vitals  Enc Vitals Group     BP 09/14/20 1149 126/88     Pulse Rate 09/14/20 1149 80     Resp --      Temp 09/14/20 1149 98.3 F (36.8 C)     Temp Source 09/14/20 1149 Oral     SpO2 09/14/20 1149 97 %     Weight 09/14/20 1150 145 lb (65.8 kg)     Height 09/14/20 1150 '5\' 2"'$  (1.575 m)     Head Circumference --  Peak Flow --      Pain Score 09/14/20 1150 8     Pain Loc --      Pain Edu? --      Excl. in [place]? --    No data found.  Updated Vital Signs BP 126/88 (BP Location: Left Arm)   Pulse 80   Temp 98.3 F (36.8 C) (Oral)   Ht '5\' 2"'$  (1.575 m)   Wt 145 lb (65.8 kg)   LMP 09/08/2020   SpO2 97%   BMI 26.52 kg/m       Physical Exam Vitals and nursing note reviewed.  Constitutional:      General: She is not in acute distress.    Appearance: Normal appearance. She is normal weight. She is not ill-appearing.  HENT:     Head: Normocephalic and atraumatic.     Mouth/Throat:     Mouth: Mucous membranes are moist.     Pharynx: Oropharynx is clear.  Eyes:     Extraocular Movements: Extraocular movements intact.     Conjunctiva/sclera: Conjunctivae normal.     Pupils: Pupils are equal, round, and reactive to light.  Cardiovascular:     Rate and Rhythm: Normal  rate and regular rhythm.     Pulses: Normal pulses.     Heart sounds: Normal heart sounds.  Pulmonary:     Effort: Pulmonary effort is normal. No respiratory distress.     Breath sounds: Normal breath sounds. No wheezing, rhonchi or rales.  Musculoskeletal:     Cervical back: Normal range of motion and neck supple. No rigidity or tenderness.     Comments: Right elbow: TTP over lateral olecranon, minimal soft tissue swelling, FROM with flexion/extension, pronation/supination.   Right wrist: Unable to perform [facility] test, positive ulnar deviation, positive Tinel sign, no deformity noted  Skin:    General: Skin is warm and dry.  Neurological:     General: No focal deficit present.     Mental Status: She is alert and oriented to person, place, and time.  Psychiatric:        Mood and Affect: Mood normal.        Behavior: Behavior normal.     UC Treatments / Results  Labs (all labs ordered are listed, but only abnormal results are displayed) Labs Reviewed - No data to display  EKG   Radiology No results found.  Procedures Procedures (including critical care time)  Medications Ordered in UC Medications - No data to display  Initial Impression / Assessment and Plan / UC Course  I have reviewed the triage vital signs and the nursing notes.  Pertinent labs & imaging results that were available during my care of the patient were reviewed by me and considered in my medical decision making (see chart for details).    MDM: 1.  Right elbow pain-advised patient to continue right elbow sleeve for right lateral epicondylitis, 2.  Tenosynovitis of right wrist-advised patient to wear right wrist brace/thumb spica 24/7 except with bathing until meeting with orthopedic provider, advised patient to take medication as directed.  Patient discharged home hemodynamically stable. Final Clinical Impressions(s) / UC Diagnoses   Final diagnoses:  Tenosynovitis of right wrist  Right elbow  pain     Discharge Instructions      Advised patient to take medication as directed with food to completion.  Encourage patient to wear right wrist brace 24/7 except when bathing) until being evaluated by orthopedic provider (contact information provided above).  ED Prescriptions     Medication Sig Dispense Auth. Provider   predniSONE (DELTASONE) 20 MG tablet Take 3 tabs PO daily x 3 days, then 2 tabs PO daily x 3 days, then 1 tab PO daily x 3 days 18 tablet Jaclyn Lofts, FNP      PDMP not reviewed this encounter.   Jaclyn Taylor, [place] 09/14/20 1314

## 2020-09-17 ENCOUNTER — Other Ambulatory Visit: Payer: Self-pay

## 2020-09-17 ENCOUNTER — Ambulatory Visit: Payer: Self-pay

## 2020-09-17 ENCOUNTER — Encounter: Payer: Self-pay | Admitting: Family Medicine

## 2020-09-17 ENCOUNTER — Ambulatory Visit (INDEPENDENT_AMBULATORY_CARE_PROVIDER_SITE_OTHER): Payer: Medicaid Other | Admitting: Family Medicine

## 2020-09-17 VITALS — BP 168/100 | Ht 62.0 in | Wt 145.0 lb

## 2020-09-17 DIAGNOSIS — G5601 Carpal tunnel syndrome, right upper limb: Secondary | ICD-10-CM

## 2020-09-17 DIAGNOSIS — M7918 Myalgia, other site: Secondary | ICD-10-CM | POA: Diagnosis not present

## 2020-09-17 DIAGNOSIS — M25521 Pain in right elbow: Secondary | ICD-10-CM

## 2020-09-17 NOTE — Progress Notes (Signed)
Jaclyn Taylor - 45 y.o. female MRN [id number]  Date of birth: 1975-06-21  SUBJECTIVE:  Including CC & ROS.  No chief complaint on file.   Jaclyn Taylor is a 45 y.o. female that is presenting with right elbow, forearm and wrist pain.  The pain has been ongoing for the past few weeks.  Seems to be worse with multiple repetitive activities.  No injury or inciting event.  Has quit the job without evidence exacerbating the pain.   Review of Systems See HPI   HISTORY: Past Medical, Surgical, Social, and Family History Reviewed & Updated per EMR.   Pertinent Historical Findings include:  Past Medical History:  Diagnosis Date   Breast cancer ([place])    Chronic back pain    COVID-19 02/2020    Past Surgical History:  Procedure Laterality Date   MASTECTOMY     TONSILLECTOMY AND ADENOIDECTOMY      Family History  Problem Relation Age of Onset   Cancer Mother        breast and lung CA   Healthy Father     Social History   Socioeconomic History   Marital status: Single    Spouse name: Not on file   Number of children: Not on file   Years of education: Not on file   Highest education level: Not on file  Occupational History   Not on file  Tobacco Use   Smoking status: Every Day   Smokeless tobacco: Never  Vaping Use   Vaping Use: Never used  Substance and Sexual Activity   Alcohol use: Not Currently   Drug use: Never   Sexual activity: Not on file  Other Topics Concern   Not on file  Social History Narrative   Not on file   Social Determinants of Health   Financial Resource Strain: Not on file  Food Insecurity: Not on file  Transportation Needs: Not on file  Physical Activity: Not on file  Stress: Not on file  Social Connections: Not on file  Intimate Partner Violence: Not on file     PHYSICAL EXAM:  VS: BP (!) 168/100 (BP Location: Right Arm, Patient Position: Sitting, Cuff Size: Normal)   Ht '5\' 2"'$  (1.575 m)   Wt 145 lb (65.8 kg)   LMP 09/08/2020   BMI  26.52 kg/m  Physical Exam Gen: NAD, alert, cooperative with exam, well-appearing MSK:  Right elbow: Normal range of motion. Normal strength resistance. Tenderness to palpation at the lateral epicondyle and posterior forearm. Normal wrist range of motion. Normal grip strength. No swelling or ecchymosis. Neurovascular intact  Limited ultrasound: Right elbow and wrist:  No effusion within the elbow. Normal-appearing radial nerve at the lateral elbow. Normal origin of the extensors of the lateral epicondyle. Increased hyperemia over the brachioradialis. Normal-appearing median nerve within the carpal tunnel.  Summary: Nonspecific hyperemia within the muscle and lower brachial radialis  Ultrasound and interpretation by Clearance Coots, MD    ASSESSMENT & PLAN:   Carpal tunnel syndrome of right wrist Symptoms at the wrist seem more consistent with carpal tunnel. -Counseled on home exercise therapy and supportive care. -Counseled on using a brace at night. -Could consider injection.  Brachioradialis muscle tenderness Has muscle tenderness at the level of the elbow and above below the elbow.  No structural changes but nonspecific hyperemia within the muscle belly appreciated. -Counseled on home exercise therapy and supportive care. -Counseled on sling for comfort. - pennsaid samples  -Could consider injection at gabapentin therapy.

## 2020-09-17 NOTE — Assessment & Plan Note (Addendum)
Has muscle tenderness at the level of the elbow and above below the elbow.  No structural changes but nonspecific hyperemia within the muscle belly appreciated. -Counseled on home exercise therapy and supportive care. -Counseled on sling for comfort. - pennsaid samples  -Could consider injection at gabapentin therapy.

## 2020-09-17 NOTE — Patient Instructions (Signed)
Nice to meet you Please try the rub on medicine  Please try the sling for comfort  Please use the wrist brace at night  Please try the exercises   Please send me a message in MyChart with any questions or updates.  Please see me back in 2-3 weeks.   --Dr. Raeford Razor

## 2020-09-17 NOTE — Progress Notes (Signed)
Medication Samples have been provided to the patient.  Drug name: Pennsaid       Strength: 2%        Qty: 2 boxes LOT: [id number]  Exp.Date: 08/2021  Dosing instructions: use a pea size amount  The patient has been instructed regarding the correct time, dose, and frequency of taking this medication, including desired effects and most common side effects.   Jaclyn Taylor 2:24 PM 09/17/2020

## 2020-09-17 NOTE — Assessment & Plan Note (Signed)
Symptoms at the wrist seem more consistent with carpal tunnel. -Counseled on home exercise therapy and supportive care. -Counseled on using a brace at night. -Could consider injection.

## 2020-09-19 ENCOUNTER — Emergency Department (INDEPENDENT_AMBULATORY_CARE_PROVIDER_SITE_OTHER)
Admission: EM | Admit: 2020-09-19 | Discharge: 2020-09-19 | Disposition: A | Discharge status: Home / Self Care | Payer: Medicaid Other | Source: Home / Self Care

## 2020-09-19 ENCOUNTER — Other Ambulatory Visit: Payer: Self-pay

## 2020-09-19 DIAGNOSIS — M79601 Pain in right arm: Secondary | ICD-10-CM

## 2020-09-19 MED ORDER — OXYCODONE-ACETAMINOPHEN 5-325 MG PO TABS: 1.0000 | ORAL_TABLET | Freq: Three times a day (TID) | ORAL | 0 refills | Status: AC | PRN

## 2020-09-19 NOTE — ED Triage Notes (Signed)
Pt states that has been seen a few times for right arm pain.pt states that she is not getting any relief.

## 2020-09-19 NOTE — ED Provider Notes (Signed)
[name] C[facility]    CSN: [id number] Arrival date & time: 09/19/20  1413      History   Chief Complaint Chief Complaint  Patient presents with   Arm Pain    Pt states that she is still having some right arm pain.    HPI Jaclyn Taylor is a 45 y.o. female.   HPI 45 year old female presents with right arm pain was evaluated by me on 09/14/2020 for tenosynovitis of right wrist and right elbow pain and was prescribed prednisone taper over 9 days, please see encounter note for that visit.  Patient was advised to follow-up with [facility]opedic provider which she did  on 09/17/2020 and was diagnosed with brachioradialis muscle tenderness please see encounter note for that visit.  Patient reports right upper/lower arm pain including right elbow has not improved.  Reports prednisone, OTC ibuprofen, Tylenol has not relieved right arm pain.  Past Medical History:  Diagnosis Date   Breast cancer ([place])    Chronic back pain    COVID-19 02/2020    Patient Active Problem List   Diagnosis Date Noted   Brachioradialis muscle tenderness 09/17/2020   Carpal tunnel syndrome of right wrist 09/17/2020    Past Surgical History:  Procedure Laterality Date   MASTECTOMY     TONSILLECTOMY AND ADENOIDECTOMY      OB History   No obstetric history on file.      Home Medications    Prior to Admission medications   Medication Sig Start Date End Date Taking? Authorizing Provider  albuterol (VENTOLIN HFA) 108 (90 Base) MCG/ACT inhaler Inhale 1-2 puffs into the lungs every 6 (six) hours as needed for wheezing or shortness of breath. 01/02/20  Yes Phelps, Erin O, PA-C  amoxicillin-clavulanate (AUGMENTIN) 875-125 MG tablet Take 1 tablet by mouth 2 (two) times daily. One po bid x 7 days 01/02/20  Yes Phelps, Erin O, PA-C  benzonatate (TESSALON) 200 MG capsule Take 1 capsule (200 mg total) by mouth 3 (three) times daily as needed for cough. 01/03/20  Yes Kandra Nicolas, MD  ipratropium  (ATROVENT) 0.06 % nasal spray Place 2 sprays into both nostrils 4 (four) times daily. 01/02/20  Yes Phelps, Bronwen Betters, PA-C  oxyCODONE-acetaminophen (PERCOCET/ROXICET) 5-325 MG tablet Take 1 tablet by mouth every 8 (eight) hours as needed for up to 7 days for severe pain. 09/19/20 09/26/20 Yes Eliezer Lofts, FNP  predniSONE (DELTASONE) 20 MG tablet Take 3 tabs PO daily x 3 days, then 2 tabs PO daily x 3 days, then 1 tab PO daily x 3 days 09/14/20  Yes Eliezer Lofts, FNP    Family History Family History  Problem Relation Age of Onset   Cancer Mother        breast and lung CA   Healthy Father     Social History Social History   Tobacco Use   Smoking status: Every Day   Smokeless tobacco: Never  Vaping Use   Vaping Use: Never used  Substance Use Topics   Alcohol use: Not Currently   Drug use: Never     Allergies   Tizanidine, Aleve [naproxen sodium], Doxycycline, and Acetaminophen   Review of Systems Review of Systems  Musculoskeletal:        Right upper/lower arm, including right elbow pain x 2 weeks  All other systems reviewed and are negative.   Physical Exam Triage Vital Signs ED Triage Vitals  Enc Vitals Group     BP 09/19/20 1452 122/80  Pulse Rate 09/19/20 1452 (!) 115     Resp --      Temp 09/19/20 1452 99 F (37.2 C)     Temp Source 09/19/20 1452 Oral     SpO2 09/19/20 1452 98 %     Weight 09/19/20 1446 145 lb (65.8 kg)     Height 09/19/20 1446 '5\' 2"'$  (1.575 m)     Head Circumference --      Peak Flow --      Pain Score 09/19/20 1446 10     Pain Loc --      Pain Edu? --      Excl. in [place]? --    No data found.  Updated Vital Signs BP 122/80 (BP Location: Right Arm)   Pulse (!) 115   Temp 99 F (37.2 C) (Oral)   Ht '5\' 2"'$  (1.575 m)   Wt 145 lb (65.8 kg)   LMP 09/08/2020   SpO2 98%   BMI 26.52 kg/m     Physical Exam Vitals and nursing note reviewed.  Constitutional:      General: She is not in acute distress.    Appearance: Normal  appearance. She is normal weight. She is ill-appearing.  HENT:     Head: Normocephalic and atraumatic.     Mouth/Throat:     Mouth: Mucous membranes are moist.     Pharynx: Oropharynx is clear.  Eyes:     Extraocular Movements: Extraocular movements intact.     Conjunctiva/sclera: Conjunctivae normal.     Pupils: Pupils are equal, round, and reactive to light.  Cardiovascular:     Rate and Rhythm: Normal rate and regular rhythm.     Pulses: Normal pulses.     Heart sounds: Normal heart sounds.  Pulmonary:     Effort: Pulmonary effort is normal. No respiratory distress.     Breath sounds: Normal breath sounds. No wheezing or rhonchi.  Musculoskeletal:        General: Swelling and tenderness present. No deformity.     Cervical back: Normal range of motion and neck supple. No rigidity.     Comments: Right upper arm (anterior inferior aspect), right elbow (lateral inferior aspect of olecranon), right lower arm (anterior superior aspect): Moderate TTP over these areas, moderate pain elicited with flexion/extension, pronation/supination, mild to moderate soft tissue swelling noted.  Skin:    General: Skin is warm and dry.  Neurological:     General: No focal deficit present.     Mental Status: She is alert and oriented to person, place, and time. Mental status is at baseline.  Psychiatric:        Mood and Affect: Mood normal.        Behavior: Behavior normal.        Thought Content: Thought content normal.     UC Treatments / Results  Labs (all labs ordered are listed, but only abnormal results are displayed) Labs Reviewed - No data to display  EKG   Radiology No results found.  Procedures Procedures (including critical care time)  Medications Ordered in UC Medications - No data to display  Initial Impression / Assessment and Plan / UC Course  I have reviewed the triage vital signs and the nursing notes.  Pertinent labs & imaging results that were available during my  care of the patient were reviewed by me and considered in my medical decision making (see chart for details).     MDM: 1.  Right arm pain-Rx'd Percocet 1-3 tabs  daily, as needed for the next 7 days.  Advised/strongly encourage patient to follow-up with [facility] orthopedic provider on Monday, 09/21/2020 for further evaluation of right arm pain. Final Clinical Impressions(s) / UC Diagnoses   Final diagnoses:  Right arm pain     Discharge Instructions      Advised patient to use pain medication sparingly and to follow-up with Orthopedic provider on Monday, 09/21/2020 for further evaluation.     ED Prescriptions     Medication Sig Dispense Auth. Provider   oxyCODONE-acetaminophen (PERCOCET/ROXICET) 5-325 MG tablet Take 1 tablet by mouth every 8 (eight) hours as needed for up to 7 days for severe pain. 21 tablet Eliezer Lofts, FNP      I have reviewed the PDMP during this encounter.   Eliezer Lofts, [place] 09/19/20 [phone]

## 2020-09-19 NOTE — Discharge Instructions (Addendum)
Advised patient to use pain medication sparingly and to follow-up with Orthopedic provider on Monday, 09/21/2020 for further evaluation.

## 2020-10-06 ENCOUNTER — Ambulatory Visit (HOSPITAL_BASED_OUTPATIENT_CLINIC_OR_DEPARTMENT_OTHER)
Admission: RE | Admit: 2020-10-06 | Discharge: 2020-10-06 | Disposition: A | Discharge status: Home / Self Care | Payer: Medicaid Other | Source: Ambulatory Visit | Attending: Family Medicine | Admitting: Family Medicine

## 2020-10-06 ENCOUNTER — Encounter: Payer: Self-pay | Admitting: Family Medicine

## 2020-10-06 ENCOUNTER — Ambulatory Visit (INDEPENDENT_AMBULATORY_CARE_PROVIDER_SITE_OTHER): Payer: Medicaid Other | Admitting: Family Medicine

## 2020-10-06 ENCOUNTER — Ambulatory Visit: Payer: Self-pay

## 2020-10-06 ENCOUNTER — Other Ambulatory Visit: Payer: Self-pay

## 2020-10-06 VITALS — BP 132/90 | Ht 62.0 in | Wt 145.0 lb

## 2020-10-06 DIAGNOSIS — M7711 Lateral epicondylitis, right elbow: Secondary | ICD-10-CM | POA: Insufficient documentation

## 2020-10-06 MED ORDER — METHYLPREDNISOLONE ACETATE 40 MG/ML IJ SUSP
40.0000 mg | Freq: Once | INTRAMUSCULAR | Status: AC
  Administered 2020-10-06: 40 mg via INTRA_ARTICULAR

## 2020-10-06 NOTE — Patient Instructions (Signed)
Good to see you Please try heat  Please try the exercises  I will call with the results.   Please send me a message in MyChart with any questions or updates.  Please see me back in 4 weeks.   --Dr. Raeford Razor

## 2020-10-06 NOTE — Assessment & Plan Note (Signed)
Having ongoing pain for the past few months.  Pain has been refractory to medications and home exercises -Counseled on home exercise therapy and supportive care. -Injection today. -X-ray. -Could consider further imaging or physical therapy.

## 2020-10-06 NOTE — Progress Notes (Signed)
Jaclyn Taylor - 45 y.o. female MRN [id number]  Date of birth: February 01, 1976  SUBJECTIVE:  Including CC & ROS.  No chief complaint on file.   Jaclyn Taylor is a 45 y.o. female that is presenting with acute worsening of her right elbow pain.  Has tried medications and rest and still having severe pain.  Has been ongoing since June.  No specific injury..   Review of Systems See HPI   HISTORY: Past Medical, Surgical, Social, and Family History Reviewed & Updated per EMR.   Pertinent Historical Findings include:  Past Medical History:  Diagnosis Date   Breast cancer ([place])    Chronic back pain    COVID-19 02/2020    Past Surgical History:  Procedure Laterality Date   MASTECTOMY     TONSILLECTOMY AND ADENOIDECTOMY      Family History  Problem Relation Age of Onset   Cancer Mother        breast and lung CA   Healthy Father     Social History   Socioeconomic History   Marital status: Single    Spouse name: Not on file   Number of children: Not on file   Years of education: Not on file   Highest education level: Not on file  Occupational History   Not on file  Tobacco Use   Smoking status: Every Day   Smokeless tobacco: Never  Vaping Use   Vaping Use: Never used  Substance and Sexual Activity   Alcohol use: Not Currently   Drug use: Never   Sexual activity: Not on file  Other Topics Concern   Not on file  Social History Narrative   Not on file   Social Determinants of Health   Financial Resource Strain: Not on file  Food Insecurity: Not on file  Transportation Needs: Not on file  Physical Activity: Not on file  Stress: Not on file  Social Connections: Not on file  Intimate Partner Violence: Not on file     PHYSICAL EXAM:  VS: BP 132/90 (BP Location: Right Arm, Patient Position: Sitting, Cuff Size: Normal)   Ht '5\' 2"'$  (1.575 m)   Wt 145 lb (65.8 kg)   LMP 10/06/2020   BMI 26.52 kg/m  Physical Exam Gen: NAD, alert, cooperative with exam, well-appearing    Limited ultrasound: Right elbow:  No effusion appreciated within the joint  Summary: No joint abnormalities appreciated  Ultrasound and interpretation by Jaclyn Coots, MD   Aspiration/Injection Procedure Note Jaclyn Taylor 02-03-1976  Procedure: Injection Indications: Right elbow pain  Procedure Details Consent: Risks of procedure as well as the alternatives and risks of each were explained to the (patient/caregiver).  Consent for procedure obtained. Time Out: Verified patient identification, verified procedure, site/side was marked, verified correct patient position, special equipment/implants available, medications/allergies/relevent history reviewed, required imaging and test results available.  Performed.  The area was cleaned with iodine and alcohol swabs.    The right lateral epicondyle was injected using 1 cc's of 40 mg Depo-Medrol and 1 cc's of 0.25% bupivacaine with a 25 1 1/2" needle.  Ultrasound was used. Images were obtained in long Views showing the injection.     A sterile dressing was applied.  Patient did tolerate procedure well.    ASSESSMENT & PLAN:   Lateral epicondylitis of right elbow Having ongoing pain for the past few months.  Pain has been refractory to medications and home exercises -Counseled on home exercise therapy and supportive care. -Injection today. -X-ray. -Could consider further  imaging or physical therapy.

## 2020-10-07 ENCOUNTER — Telehealth: Payer: Self-pay | Admitting: Family Medicine

## 2020-10-07 DIAGNOSIS — M899 Disorder of bone, unspecified: Secondary | ICD-10-CM

## 2020-10-07 NOTE — Telephone Encounter (Signed)
Spoke with patient about her x-ray findings.  She does have a history of breast cancer and there is concerned about metastasis to the humerus.  We will get an x-ray of her right shoulder.  Pursue an MRI with and without contrast of the right humerus to evaluate for metastasis.  Placed an order for nuclear bone scan for whole body.  Will get updated lab work of CBC with differential and CMP.  Rosemarie Ax, MD [facility] 10/07/2020, 12:24 PM

## 2020-10-08 ENCOUNTER — Telehealth: Payer: Self-pay | Admitting: Family Medicine

## 2020-10-08 ENCOUNTER — Ambulatory Visit (HOSPITAL_BASED_OUTPATIENT_CLINIC_OR_DEPARTMENT_OTHER)
Admission: RE | Admit: 2020-10-08 | Discharge: 2020-10-08 | Disposition: A | Discharge status: Home / Self Care | Payer: Medicaid Other | Source: Ambulatory Visit | Attending: Family Medicine | Admitting: Family Medicine

## 2020-10-08 ENCOUNTER — Other Ambulatory Visit: Payer: Self-pay

## 2020-10-08 DIAGNOSIS — M899 Disorder of bone, unspecified: Secondary | ICD-10-CM | POA: Diagnosis not present

## 2020-10-08 MED ORDER — HYDROCODONE-ACETAMINOPHEN 5-325 MG PO TABS: 1.0000 | ORAL_TABLET | Freq: Three times a day (TID) | ORAL | 0 refills | Status: DC | PRN

## 2020-10-08 NOTE — Telephone Encounter (Signed)
Provided norco for pain.   Rosemarie Ax, MD [facility] 10/08/2020, 5:22 PM

## 2020-10-08 NOTE — Telephone Encounter (Signed)
Patient called states she is still in quite a bit of pain w/ the Elbow & request pain meds .  --Forwarding request to provider for review.  --Pt uses :    [id number] [address]  [phone] Fax:  [phone]   --glh

## 2020-10-12 ENCOUNTER — Telehealth: Payer: Self-pay | Admitting: Family Medicine

## 2020-10-12 MED ORDER — ONDANSETRON HCL 4 MG PO TABS: 4.0000 mg | ORAL_TABLET | Freq: Three times a day (TID) | ORAL | 0 refills | Status: AC | PRN

## 2020-10-12 NOTE — Telephone Encounter (Signed)
Pt called to request a diff pain med,says hydrocodone is causing nausea, diarrhea & sleepiness  .  --Forwarding request to provider for review.  Pt uses :   [id number]  [phone] Fax:  [phone]   --glh

## 2020-10-12 NOTE — Telephone Encounter (Signed)
Provided Zofran for her nausea with pain medication.  Rosemarie Ax, MD [facility] 10/12/2020, 4:53 PM

## 2020-10-12 NOTE — Telephone Encounter (Signed)
Informed of results.   Rosemarie Ax, MD [facility] 10/12/2020, 8:26 AM

## 2020-10-13 ENCOUNTER — Other Ambulatory Visit: Payer: Self-pay | Admitting: Family Medicine

## 2020-10-13 LAB — CBC WITH DIFFERENTIAL
Basophils Absolute: 0 10*3/uL (ref 0.0–0.2)
Basos: 0 %
EOS (ABSOLUTE): 0 10*3/uL (ref 0.0–0.4)
Eos: 1 %
Hematocrit: 37.9 % (ref 34.0–46.6)
Hemoglobin: 12.6 g/dL (ref 11.1–15.9)
Immature Grans (Abs): 0 10*3/uL (ref 0.0–0.1)
Immature Granulocytes: 0 %
Lymphocytes Absolute: 1.4 10*3/uL (ref 0.7–3.1)
Lymphs: 18 %
MCH: 30 pg (ref 26.6–33.0)
MCHC: 33.2 g/dL (ref 31.5–35.7)
MCV: 90 fL (ref 79–97)
Monocytes Absolute: 0.8 10*3/uL (ref 0.1–0.9)
Monocytes: 10 %
Neutrophils Absolute: 5.4 10*3/uL (ref 1.4–7.0)
Neutrophils: 71 %
RBC: 4.2 x10E6/uL (ref 3.77–5.28)
RDW: 16.3 % — ABNORMAL HIGH (ref 11.7–15.4)
WBC: 7.7 10*3/uL (ref 3.4–10.8)

## 2020-10-13 LAB — COMPREHENSIVE METABOLIC PANEL
ALT: 10 IU/L (ref 0–32)
AST: 19 IU/L (ref 0–40)
Albumin/Globulin Ratio: 2.6 — ABNORMAL HIGH (ref 1.2–2.2)
Albumin: 4.5 g/dL (ref 3.8–4.8)
Alkaline Phosphatase: 64 IU/L (ref 44–121)
BUN/Creatinine Ratio: 10 (ref 9–23)
BUN: 6 mg/dL (ref 6–24)
Bilirubin Total: <0.2 mg/dL (ref 0.0–1.2)
CO2: 22 mmol/L (ref 20–29)
Calcium: 9 mg/dL (ref 8.7–10.2)
Chloride: 102 mmol/L (ref 96–106)
Creatinine, Ser: 0.63 mg/dL (ref 0.57–1.00)
Globulin, Total: 1.7 g/dL (ref 1.5–4.5)
Glucose: 87 mg/dL (ref 65–99)
Potassium: 4.4 mmol/L (ref 3.5–5.2)
Sodium: 141 mmol/L (ref 134–144)
Total Protein: 6.2 g/dL (ref 6.0–8.5)
eGFR: 111 mL/min/{1.73_m2} (ref >59)

## 2020-10-13 MED ORDER — HYDROCODONE-ACETAMINOPHEN 5-325 MG PO TABS: 1.0000 | ORAL_TABLET | Freq: Three times a day (TID) | ORAL | 0 refills | Status: DC | PRN

## 2020-10-19 ENCOUNTER — Other Ambulatory Visit: Payer: Self-pay | Admitting: Family Medicine

## 2020-10-19 MED ORDER — HYDROCODONE-ACETAMINOPHEN 5-325 MG PO TABS: 1.0000 | ORAL_TABLET | Freq: Three times a day (TID) | ORAL | 0 refills | Status: DC | PRN

## 2020-10-23 ENCOUNTER — Other Ambulatory Visit: Payer: Self-pay | Admitting: Family Medicine

## 2020-10-23 MED ORDER — HYDROCODONE-ACETAMINOPHEN 5-325 MG PO TABS: 1.0000 | ORAL_TABLET | Freq: Three times a day (TID) | ORAL | 0 refills | Status: DC | PRN

## 2020-10-27 ENCOUNTER — Ambulatory Visit
Admission: RE | Admit: 2020-10-27 | Discharge: 2020-10-27 | Disposition: A | Discharge status: Home / Self Care | Payer: Medicaid Other | Source: Ambulatory Visit | Attending: Family Medicine | Admitting: Family Medicine

## 2020-10-27 ENCOUNTER — Encounter: Payer: Self-pay | Admitting: Family Medicine

## 2020-10-27 ENCOUNTER — Other Ambulatory Visit: Payer: Self-pay

## 2020-10-27 DIAGNOSIS — M899 Disorder of bone, unspecified: Secondary | ICD-10-CM

## 2020-10-27 MED ORDER — GADOBUTROL 1 MMOL/ML IV SOLN
6.0000 mL | Freq: Once | INTRAVENOUS | Status: AC | PRN
  Administered 2020-10-27: 6 mL via INTRAVENOUS

## 2020-10-28 ENCOUNTER — Telehealth (INDEPENDENT_AMBULATORY_CARE_PROVIDER_SITE_OTHER): Payer: Medicaid Other | Admitting: Family Medicine

## 2020-10-28 ENCOUNTER — Other Ambulatory Visit: Payer: Self-pay | Admitting: Family Medicine

## 2020-10-28 DIAGNOSIS — M869 Osteomyelitis, unspecified: Secondary | ICD-10-CM

## 2020-10-28 MED ORDER — HYDROCODONE-ACETAMINOPHEN 5-325 MG PO TABS: 1.0000 | ORAL_TABLET | Freq: Three times a day (TID) | ORAL | 0 refills | Status: DC | PRN

## 2020-10-28 MED ORDER — GABAPENTIN 300 MG PO CAPS: 300.0000 mg | ORAL_CAPSULE | Freq: Three times a day (TID) | ORAL | 3 refills | Status: DC

## 2020-10-28 MED ORDER — MELOXICAM 7.5 MG PO TABS: 7.5000 mg | ORAL_TABLET | Freq: Two times a day (BID) | ORAL | 1 refills | Status: AC | PRN

## 2020-10-28 NOTE — Progress Notes (Signed)
Virtual Visit via Video Note  I connected with Jaclyn Taylor on 10/28/20 at  1:10 PM EDT by a video enabled telemedicine application and verified that I am speaking with the correct person using two identifiers.  Location: Patient: home Provider: office   I discussed the limitations of evaluation and management by telemedicine and the availability of in person appointments. The patient expressed understanding and agreed to proceed.  History of Present Illness:  [name] is a 45 year old female that is following up after the MRI of her right humerus.  This was indicating cortical thinning of the humerus with periostitis.  This appears to be consistent with an aggressive process with primary considerations are metastatic disease with a history of breast cancer versus myeloma.  Observations/Objective:   Assessment and Plan:  Periostitis:  MRI of right humerus is concerning for a metastatic process given her history of breast cancer versus myeloma. -Referral to orthopedic surgery. -Referral to oncology.   Follow Up Instructions:    I discussed the assessment and treatment plan with the patient. The patient was provided an opportunity to ask questions and all were answered. The patient agreed with the plan and demonstrated an understanding of the instructions.   The patient was advised to call back or seek an in-person evaluation if the symptoms worsen or if the condition fails to improve as anticipated.    Clearance Coots, MD

## 2020-10-28 NOTE — Assessment & Plan Note (Addendum)
MRI of right humerus is concerning for a metastatic process given her history of breast cancer versus myeloma. -Referral to orthopedic surgery. -Referral to oncology.

## 2020-10-29 ENCOUNTER — Encounter: Payer: Self-pay | Admitting: *Deleted

## 2020-10-29 NOTE — Patient Instructions (Signed)
[facility] Dr Tamera Punt 11/11/20 @ 1015a on Wednesday [address] [place]

## 2020-10-29 NOTE — Progress Notes (Signed)
Reached out to Jaclyn Taylor to introduce myself as the office RN Navigator and explain our new patient process. Reviewed the reason for their referral and scheduled their new patient appointment along with labs. Provided address and directions to the office including call back phone number. Reviewed with patient any concerns they may have or any possible barriers to attending their appointment.   She had several questions about work up, timeline and expectations which I answered to her satisfaction. She also requested that I send appointment info to her via [place].   Oncology Nurse Navigator Documentation  Oncology Nurse Navigator Flowsheets 10/29/2020  Abnormal Finding Date 10/28/2020  Diagnosis Status Additional Work Up  Navigator Follow Up Date: 10/30/2020  Navigator Follow Up Reason: New Patient Appointment  Navigator Location [facility]  Referral Date to RadOnc/MedOnc 10/28/2020  Navigator Encounter Type Introductory Phone Call  Treatment Phase Abnormal Scans  Barriers/Navigation Needs Coordination of Care;Education  Education Other  Interventions Coordination of Care;Education  Acuity Level 2-Minimal Needs (1-2 Barriers Identified)  Coordination of Care Appts  Education Method Verbal;Written  Time Spent with Patient 45

## 2020-10-30 ENCOUNTER — Encounter: Payer: Self-pay | Admitting: *Deleted

## 2020-10-30 ENCOUNTER — Inpatient Hospital Stay: Payer: Medicaid Other | Attending: Hematology & Oncology

## 2020-10-30 ENCOUNTER — Encounter: Payer: Self-pay | Admitting: Hematology & Oncology

## 2020-10-30 ENCOUNTER — Other Ambulatory Visit: Payer: Self-pay

## 2020-10-30 ENCOUNTER — Inpatient Hospital Stay (HOSPITAL_BASED_OUTPATIENT_CLINIC_OR_DEPARTMENT_OTHER): Payer: Medicaid Other | Admitting: Hematology & Oncology

## 2020-10-30 VITALS — BP 136/94 | HR 81 | Temp 98.3°F | Resp 16 | Ht 62.0 in | Wt 145.0 lb

## 2020-10-30 DIAGNOSIS — Z170 Estrogen receptor positive status [ER+]: Secondary | ICD-10-CM | POA: Insufficient documentation

## 2020-10-30 DIAGNOSIS — C50011 Malignant neoplasm of nipple and areola, right female breast: Secondary | ICD-10-CM

## 2020-10-30 DIAGNOSIS — Z9013 Acquired absence of bilateral breasts and nipples: Secondary | ICD-10-CM | POA: Diagnosis not present

## 2020-10-30 DIAGNOSIS — C7951 Secondary malignant neoplasm of bone: Secondary | ICD-10-CM | POA: Insufficient documentation

## 2020-10-30 DIAGNOSIS — Z79899 Other long term (current) drug therapy: Secondary | ICD-10-CM | POA: Diagnosis not present

## 2020-10-30 DIAGNOSIS — C50912 Malignant neoplasm of unspecified site of left female breast: Secondary | ICD-10-CM | POA: Diagnosis present

## 2020-10-30 DIAGNOSIS — C50919 Malignant neoplasm of unspecified site of unspecified female breast: Secondary | ICD-10-CM | POA: Diagnosis not present

## 2020-10-30 DIAGNOSIS — Z8616 Personal history of COVID-19: Secondary | ICD-10-CM | POA: Insufficient documentation

## 2020-10-30 DIAGNOSIS — Z7189 Other specified counseling: Secondary | ICD-10-CM

## 2020-10-30 DIAGNOSIS — F17210 Nicotine dependence, cigarettes, uncomplicated: Secondary | ICD-10-CM | POA: Diagnosis not present

## 2020-10-30 HISTORY — DX: Secondary malignant neoplasm of bone: C79.51

## 2020-10-30 HISTORY — DX: Malignant neoplasm of unspecified site of unspecified female breast: C50.919

## 2020-10-30 HISTORY — DX: Other specified counseling: Z71.89

## 2020-10-30 LAB — CMP (CANCER CENTER ONLY)
ALT: 7 U/L (ref 0–44)
AST: 12 U/L — ABNORMAL LOW (ref 15–41)
Albumin: 4.4 g/dL (ref 3.5–5.0)
Alkaline Phosphatase: 54 U/L (ref 38–126)
Anion gap: 10 (ref 5–15)
BUN: 6 mg/dL (ref 6–20)
CO2: 26 mmol/L (ref 22–32)
Calcium: 9.2 mg/dL (ref 8.9–10.3)
Chloride: 104 mmol/L (ref 98–111)
Creatinine: 0.74 mg/dL (ref 0.44–1.00)
GFR, Estimated: >60 mL/min (ref >60)
Glucose, Bld: 98 mg/dL (ref 70–99)
Potassium: 3.8 mmol/L (ref 3.5–5.1)
Sodium: 140 mmol/L (ref 135–145)
Total Bilirubin: 0.6 mg/dL (ref 0.3–1.2)
Total Protein: 6.5 g/dL (ref 6.5–8.1)

## 2020-10-30 LAB — CBC WITH DIFFERENTIAL (CANCER CENTER ONLY)
Abs Immature Granulocytes: 0.03 10*3/uL (ref 0.00–0.07)
Basophils Absolute: 0.1 10*3/uL (ref 0.0–0.1)
Basophils Relative: 1 %
Eosinophils Absolute: 0 10*3/uL (ref 0.0–0.5)
Eosinophils Relative: 1 %
HCT: 37.2 % (ref 36.0–46.0)
Hemoglobin: 12.2 g/dL (ref 12.0–15.0)
Immature Granulocytes: 0 %
Lymphocytes Relative: 16 %
Lymphs Abs: 1.3 10*3/uL (ref 0.7–4.0)
MCH: 30.3 pg (ref 26.0–34.0)
MCHC: 32.8 g/dL (ref 30.0–36.0)
MCV: 92.5 fL (ref 80.0–100.0)
Monocytes Absolute: 0.5 10*3/uL (ref 0.1–1.0)
Monocytes Relative: 6 %
Neutro Abs: 6.1 10*3/uL (ref 1.7–7.7)
Neutrophils Relative %: 76 %
Platelet Count: 248 10*3/uL (ref 150–400)
RBC: 4.02 MIL/uL (ref 3.87–5.11)
RDW: 17.2 % — ABNORMAL HIGH (ref 11.5–15.5)
WBC Count: 8.1 10*3/uL (ref 4.0–10.5)
nRBC: 0 % (ref 0.0–0.2)

## 2020-10-30 LAB — SAVE SMEAR(SSMR), FOR PROVIDER SLIDE REVIEW

## 2020-10-30 LAB — LACTATE DEHYDROGENASE: LDH: 165 U/L (ref 98–192)

## 2020-10-30 MED ORDER — BUPROPION HCL ER (XL) 150 MG PO TB24: 150.0000 mg | ORAL_TABLET | Freq: Every day | ORAL | 4 refills | Status: DC

## 2020-10-30 MED ORDER — FENTANYL 12 MCG/HR TD PT72: 1.0000 | MEDICATED_PATCH | TRANSDERMAL | 0 refills | Status: DC

## 2020-10-30 MED ORDER — MORPHINE SULFATE 15 MG PO TABS: 15.0000 mg | ORAL_TABLET | ORAL | 0 refills | Status: DC | PRN

## 2020-10-30 NOTE — Addendum Note (Signed)
Addended by: Burney Gauze R on: 10/30/2020 05:18 PM   Modules accepted: Orders

## 2020-10-30 NOTE — Progress Notes (Signed)
Initial RN Navigator Patient Visit  Name: Jaclyn Taylor Date of Referral : 10/28/2020 Diagnosis: Possible Metastatic Breast Cancer  Met with patient prior to their visit with MD. Hanley Seamen patient "Your Patient Navigator" handout which explains my role, areas in which I am able to help, and all the contact information for myself and the office. Also gave patient MD and Navigator business card. Reviewed with patient the general overview of expected course after initial diagnosis and time frame for all steps to be completed.  New patient packet given to patient which includes: orientation to office and staff; campus directory; education on My Chart and Advance Directives; and patient centered education on breast cancer.   Patient lives at home with her 9yo daughter. She is not currently working. She has supportive friends in her life that can help as needed.   Patient completed visit with Dr. Marin Olp.  He has spoken to Dr Jerald Kief office and they will get her in to be seen. She also needs a PET scan scheduled.   Will schedule PET once PA obtained.   Patient understands all follow up procedures and expectations. They have my number to reach out for any further clarification or additional needs.    Oncology Nurse Navigator Documentation  Oncology Nurse Navigator Flowsheets 10/30/2020  Abnormal Finding Date -  Diagnosis Status -  Navigator Follow Up Date: 11/02/2020  Navigator Follow Up Reason: Radiology  Navigator Location [facility]  Referral Date to RadOnc/MedOnc -  Navigator Encounter Type Initial MedOnc  Treatment Phase Abnormal Scans  Barriers/Navigation Needs Coordination of Care;Education  Education Other  Interventions Coordination of Care;Education  Acuity Level 2-Minimal Needs (1-2 Barriers Identified)  Coordination of Care Appts;Radiology  Education Method Verbal;Written  Support Groups/Services Friends and Family  Time Spent with Patient 30

## 2020-10-30 NOTE — Progress Notes (Signed)
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 : I have a lot of pain in my right arm.  HPI: Jaclyn Taylor is a very charming 45 year old premenopausal white female.  She has a very interesting history.  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  She said that she had accidentally hit her left breast with her hand.  She felt a lump.  She subsequently was found to have invasive ductal carcinoma.  She was seen at [facility] by Dr. Clovis Riley.  As always, he does not incredibly thorough job with his patients.  He performed a bilateral mastectomy.  The pathology at the time showed an invasive ductal carcinoma.  She had 2 lymph nodes that were positive.  I am pretty sure she was stage IIb.  Apparently, she was offered adjuvant chemotherapy and radiation therapy.  Not sure exactly asked him what happened but she never did receive these.  She subsequently has been lost to follow-up at [facility].  Recently, she says she was at work and was picking up an item.  This was with her right arm.  She felt a pain in the right upper arm.  This seemed to worsen over time.  She ultimately went to urgent care.  She was given some medication urgent care which did not help her out.  She was seen by sports medicine.  She had a MRI done of the arm.  This was done on 10/27/2020.  This showed a lesion in the mid to distal humerus.  There was cortical thinning and areas of cortical breakthrough.  This measured 2.1 x 1.8 cm x 11.4 cm.  There were additional small lesions identified within the greater tuberosity, coronoid process of the ulna and the 2 right lateral ribs.  Based on this, she was quickly referred to the [facility] for an evaluation.  She is having a lot of pain in the arm.  I suspect that she will need to have surgical repair of this.  I spoke with Dr. Jerald Kief PA today.  He will get  her in to be seen quickly.  I do believe that this is going to end up being metastatic breast cancer.  She does smoke.  She does not smoke all that much.  She has rare alcohol drink.  There is a history of breast cancer in a aunt that his maternal aunt a paternal grandmother.  She has 4 children.  She had her first child I think when she was 21 years old..  She still has her monthly cycles.  There is no cough or shortness of breath.  She has had no abdominal pain.  I do think there has been any weight loss.  There is no change in bowel or bladder habits.  Currently, I would say her performance status is ECOG 1.    Past Medical History:  Diagnosis Date   Breast cancer ([place])    Chronic back pain    COVID-19 02/2020  :   Past Surgical History:  Procedure Laterality Date   MASTECTOMY     TONSILLECTOMY AND ADENOIDECTOMY    :   Current Outpatient Medications:    buPROPion (WELLBUTRIN XL) 150 MG 24 hr tablet, Take 1 tablet (150 mg total) by mouth daily., Disp: 30 tablet, Rfl: 4   fentaNYL (DURAGESIC) 12 MCG/HR, Place 1 patch onto the skin every 3 (three) days., Disp: 10 patch, Rfl: 0  gabapentin (NEURONTIN) 300 MG capsule, Take 1 capsule (300 mg total) by mouth 3 (three) times daily., Disp: 180 capsule, Rfl: 3   HYDROcodone-acetaminophen (NORCO/VICODIN) 5-325 MG tablet, Take 1 tablet by mouth every 8 (eight) hours as needed., Disp: 15 tablet, Rfl: 0   meloxicam (MOBIC) 7.5 MG tablet, Take 1 tablet (7.5 mg total) by mouth 2 (two) times daily as needed., Disp: 45 tablet, Rfl: 1   ondansetron (ZOFRAN) 4 MG tablet, Take 1 tablet (4 mg total) by mouth every 8 (eight) hours as needed for nausea or vomiting., Disp: 20 tablet, Rfl: 0:  :   Allergies  Allergen Reactions   Tizanidine Anaphylaxis and Palpitations   Aleve [Naproxen Sodium] Nausea And Vomiting   Doxycycline     Nausea   Acetaminophen Nausea Only    Other reaction(s): GI Upset (intolerance)  :   Family History   Problem Relation Age of Onset   Cancer Mother        breast and lung CA   Healthy Father   :   Social History   Socioeconomic History   Marital status: Single    Spouse name: Not on file   Number of children: Not on file   Years of education: Not on file   Highest education level: Not on file  Occupational History   Not on file  Tobacco Use   Smoking status: Every Day   Smokeless tobacco: Never  Vaping Use   Vaping Use: Never used  Substance and Sexual Activity   Alcohol use: Not Currently   Drug use: Never   Sexual activity: Not on file  Other Topics Concern   Not on file  Social History Narrative   Not on file   Social Determinants of Health   Financial Resource Strain: Not on file  Food Insecurity: Not on file  Transportation Needs: Not on file  Physical Activity: Not on file  Stress: Not on file  Social Connections: Not on file  Intimate Partner Violence: Not on file  :  Review of Systems  Constitutional: Negative.   HENT: Negative.    Eyes: Negative.   Respiratory: Negative.    Cardiovascular: Negative.   Gastrointestinal: Negative.   Genitourinary: Negative.   Musculoskeletal:  Positive for back pain and joint pain.  Skin: Negative.   Neurological: Negative.   Endo/Heme/Allergies: Negative.   Psychiatric/Behavioral: Negative.      Exam: @IPVITALS @ This is a well-developed and well-nourished white female in mild distress secondary to pain in the right upper arm.  Her vital signs show temperature of 98.3.  Pulse 81.  Blood pressure 136/94.  Weight is 145 pounds.  Head and neck exam shows no ocular or oral lesions.  There are no palpable cervical or supraclavicular lymph nodes.  Lungs are clear to percussion and auscultation bilaterally.  Cardiac exam regular rate and rhythm with no murmurs, rubs or bruits.  Axillary exam shows no bilateral axillary lymph nodes.  Abdomen is soft.  She has good bowel sounds.  There is no fluid wave.  There is no palpable  liver or spleen tip.  Back exam shows no tenderness over the spine, ribs or hips.  Extremities does show tenderness in the right humerus.  This is in the mid portion of the humerus.  She has decreased range of motion of the right shoulder.  There may be little bit of discomfort with the right elbow.  Neurological exam shows no focal neurological deficits.  Skin exam shows no rashes, ecchymoses  or petechia.   Recent Labs    10/30/20 1057  WBC 8.1  HGB 12.2  HCT 37.2  PLT 248    Recent Labs    10/30/20 1054  NA 140  K 3.8  CL 104  CO2 26  GLUCOSE 98  BUN 6  CREATININE 0.74  CALCIUM 9.2    Blood smear review: None  Pathology: See above    Assessment and Plan: Jaclyn Taylor is a very charming 45 year old white female.  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  I suspect now that she has metastatic disease.  We will set her up with a PET scan to see exactly what is going on with respect to any metastatic disease.  I have to believe that by the appearance of the MRI that she clearly has metastatic disease to the right humerus and also to the ribs.  Again I spoke with orthopedic surgery.  Hopefully they will get her in next week for an evaluation.  I am convinced that she will need to have the humerus repaired surgically.  Afterwards, she will need to have radiation therapy.  We will see what her CA 27.29 is.  I have to believe this is going to be elevated.  I cannot imagine that she would have another acute malignancy.  If we do find that this is metastatic breast cancer, we are going to have to make her menopausal.  Given that her tumor is estrogen positive, we need to deplete her estrogen chemically.  We can give her Lupron for this.  I suspect that we also will need to have additional therapy.  When she is menopausal, we can see about putting her on Faslodex and a CDK4/CDK6 inhibitor.  She will also need to have Zometa or Xgeva depending on what her  insurance covers.  I had a long talk with her.  I think she has a good understanding of what we might be up against.  I will also see about putting her on a Duragesic patch.  She will need some kind chronic pain control.  She has a 65-year-old at home that she does not want to have an altered mental state with.  Hopefully, with a low-dose Duragesic patch, she will not get a "booster" of pain medication that could affect her cognition.  We will try to move through this as quickly as possible.  Again, I have to believe that we are looking at metastatic disease now.

## 2020-10-31 LAB — CANCER ANTIGEN 27.29: CA 27.29: 24.5 U/mL (ref 0.0–38.6)

## 2020-11-02 ENCOUNTER — Other Ambulatory Visit: Payer: Self-pay | Admitting: Family

## 2020-11-02 ENCOUNTER — Encounter: Payer: Self-pay | Admitting: *Deleted

## 2020-11-02 DIAGNOSIS — C50919 Malignant neoplasm of unspecified site of unspecified female breast: Secondary | ICD-10-CM

## 2020-11-02 DIAGNOSIS — C7951 Secondary malignant neoplasm of bone: Secondary | ICD-10-CM

## 2020-11-02 NOTE — Progress Notes (Signed)
PET Scan scheduled for 11/16/2020 at 2pm.   Called patient and she is aware of appointment time, date and location. Reviewed PET prep with her. Radiology info sheet mailed to her home for reinforcement of education.   Oncology Nurse Navigator Documentation  Oncology Nurse Navigator Flowsheets 11/02/2020  Abnormal Finding Date -  Diagnosis Status -  Navigator Follow Up Date: 11/16/2020  Navigator Follow Up Reason: Scan Review  Navigator Location [facility]  Referral Date to RadOnc/MedOnc -  Navigator Encounter Type Appt/Treatment Plan Review;Telephone  Telephone Education;Appt Confirmation/Clarification;Outgoing Call  Treatment Phase Abnormal Scans  Barriers/Navigation Needs Coordination of Care;Education  Education Other  Interventions Coordination of Care;Education;Psycho-Social Support  Acuity Level 2-Minimal Needs (1-2 Barriers Identified)  Coordination of Care Radiology  Education Method Verbal;Written  Support Groups/Services Friends and Family  Time Spent with Patient 30

## 2020-11-03 ENCOUNTER — Encounter: Payer: Self-pay | Admitting: *Deleted

## 2020-11-03 ENCOUNTER — Telehealth: Payer: Self-pay

## 2020-11-03 NOTE — Progress Notes (Signed)
Per MyChart message received on 11/02/2020 spoke to Dr Marin Olp about switching the Morphine to Oxy as she feels she tolerates this better. Spoke to Dr Marin Olp. He doesn't want to switch to oxy, and would like our office to try and get his original prescription, fentanyl patches, approved through insurance. PA request sent to our insurance specialists. Message including this information sent to patient.   Received message from patient that she still has not yet heard from Dr [facility] office. [place] at 1330 and spoke to their referral specialist. She was able to get patient scheduled with Dr Jerald Kief PA tomorrow. They will call patient and give her appointment details.   Per Dr Marin Olp: Call -- find out when she sees Orthopedic Surgery.  The tumor marker is actually normal.  This is surprising.  We will need a biopsy when she has surgery.   Message sent to Mitzi Hansen, Dr [facility] PA regarding this request.   Oncology Nurse Navigator Documentation  Oncology Nurse Navigator Flowsheets 11/03/2020  Abnormal Finding Date -  Diagnosis Status -  Navigator Follow Up Date: 11/16/2020  Navigator Follow Up Reason: Scan Review  Navigator Location [facility]  Referral Date to RadOnc/MedOnc -  Navigator Encounter Type MyChart;Telephone  Telephone Outgoing Call;Diagnostic Results;Education  Treatment Phase Abnormal Scans  Barriers/Navigation Needs Coordination of Care;Education  Education Other  Interventions Coordination of Care;Education;Psycho-Social Support  Acuity Level 2-Minimal Needs (1-2 Barriers Identified)  Coordination of Care Other  Education Method Written  Support Groups/Services Friends and Family  Time Spent with Patient 30

## 2020-11-03 NOTE — Telephone Encounter (Signed)
No 10/30/20 LOS noted, inbasket to  Dr Marin Olp and Asencion Partridge to advise

## 2020-11-04 ENCOUNTER — Encounter: Payer: Self-pay | Admitting: *Deleted

## 2020-11-04 ENCOUNTER — Other Ambulatory Visit: Payer: Self-pay | Admitting: *Deleted

## 2020-11-04 MED ORDER — MORPHINE SULFATE 15 MG PO TABS: 15.0000 mg | ORAL_TABLET | ORAL | 0 refills | Status: DC | PRN

## 2020-11-04 NOTE — Progress Notes (Signed)
Prior authorizations for patient's fentanyl patch and morphine has been completed and approved. Sent patient MyChart message with update.   Patient appointment with [facility] has been moved to Friday morning.   Oncology Nurse Navigator Documentation  Oncology Nurse Navigator Flowsheets 11/04/2020  Abnormal Finding Date -  Diagnosis Status -  Navigator Follow Up Date: 11/16/2020  Navigator Follow Up Reason: Scan Review  Navigator Location [facility]  Referral Date to RadOnc/MedOnc -  Navigator Encounter Type MyChart;Education  Telephone -  Treatment Phase Abnormal Scans  Barriers/Navigation Needs Coordination of Care;Education  Education Pain/ Symptom Management  Interventions Education;Medication Assistance  Acuity Level 2-Minimal Needs (1-2 Barriers Identified)  Coordination of Care -  Education Method Written  Support Groups/Services Friends and Family  Time Spent with Patient 15

## 2020-11-04 NOTE — Progress Notes (Signed)
Patient requesting refill on her Morphine. This and her fentanyl were approved by insurance, but her pharmacy won't have the fentanyl patched until Friday. Her 5 day supply of Morphine would run out today.   Spoke with Dr Marin Olp who will send refill. Patient notified.   Oncology Nurse Navigator Documentation  Oncology Nurse Navigator Flowsheets 11/04/2020  Abnormal Finding Date -  Diagnosis Status -  Navigator Follow Up Date: 11/16/2020  Navigator Follow Up Reason: Scan Review  Navigator Location [facility]  Referral Date to RadOnc/MedOnc -  Navigator Encounter Type MyChart  Telephone -  Treatment Phase Abnormal Scans  Barriers/Navigation Needs Coordination of Care;Education  Education -  Interventions Medication Assistance  Acuity Level 2-Minimal Needs (1-2 Barriers Identified)  Coordination of Care -  Education Method Written  Support Groups/Services Friends and Family  Time Spent with Patient 15

## 2020-11-05 ENCOUNTER — Other Ambulatory Visit: Payer: Self-pay | Admitting: Hematology & Oncology

## 2020-11-05 ENCOUNTER — Encounter: Payer: Self-pay | Admitting: *Deleted

## 2020-11-05 MED ORDER — OXYCODONE HCL 5 MG PO TABS: 5.0000 mg | ORAL_TABLET | ORAL | 0 refills | Status: DC | PRN

## 2020-11-05 NOTE — Progress Notes (Signed)
Patient sent MyChart message once again requesting oxycodone be sent rather than morphine as she has left side effects on the oxycodone. Spoke to Dr Marin Olp again, and he agreed to send in a new prescription.   Called patient's pharmacy and cancelled the morphine prescription. She had not yet picked it up, so prescription was cancelled successfully.   Message sent to patient via [place] notifying her that old prescription was cancelled and new prescription will be sent.   Oncology Nurse Navigator Documentation  Oncology Nurse Navigator Flowsheets 11/05/2020  Abnormal Finding Date -  Diagnosis Status -  Navigator Follow Up Date: 11/16/2020  Navigator Follow Up Reason: Scan Review  Navigator Location [facility]  Referral Date to RadOnc/MedOnc -  Navigator Encounter Type MyChart;Telephone  Telephone Outgoing Call  Treatment Phase Abnormal Scans  Barriers/Navigation Needs Coordination of Care;Education  Education Other  Interventions Medication Assistance  Acuity Level 2-Minimal Needs (1-2 Barriers Identified)  Coordination of Care -  Education Method Written  Support Groups/Services Friends and Family  Time Spent with Patient 30

## 2020-11-10 ENCOUNTER — Encounter: Payer: Self-pay | Admitting: *Deleted

## 2020-11-10 ENCOUNTER — Other Ambulatory Visit: Payer: Self-pay | Admitting: Hematology & Oncology

## 2020-11-10 DIAGNOSIS — C50919 Malignant neoplasm of unspecified site of unspecified female breast: Secondary | ICD-10-CM

## 2020-11-10 MED ORDER — OXYCODONE HCL 5 MG PO TABS: 5.0000 mg | ORAL_TABLET | ORAL | 0 refills | Status: DC | PRN

## 2020-11-10 NOTE — Telephone Encounter (Signed)
Oxycodone 5 mg #30 sent to pharmacy 11/06/20. Pt had to use this for breakthrough pain due to not getting Fentanyl patch until yesterday. Please advise for a refill on the Oxycodone #30. Thank you!

## 2020-11-10 NOTE — Progress Notes (Signed)
Patient scheduled for surgery on 11/12/2020. Dr Marin Olp has spoken to Dr Tamera Punt as requested.   Oncology Nurse Navigator Documentation  Oncology Nurse Navigator Flowsheets 11/10/2020  Abnormal Finding Date -  Diagnosis Status -  Navigator Follow Up Date: 11/12/2020  Navigator Follow Up Reason: Surgery  Navigator Location [facility]  Referral Date to RadOnc/MedOnc -  Navigator Encounter Type Appt/Treatment Plan Review  Telephone -  Treatment Phase Abnormal Scans  Barriers/Navigation Needs Coordination of Care;Education  Education -  Interventions None Required  Acuity Level 2-Minimal Needs (1-2 Barriers Identified)  Coordination of Care -  Education Method -  Support Groups/Services Friends and Family  Time Spent with Patient 15

## 2020-11-10 NOTE — Addendum Note (Signed)
Addended by: Fabio Neighbors A on: 11/10/2020 03:18 PM   Modules accepted: Orders

## 2020-11-10 NOTE — Addendum Note (Signed)
Addended by: Fabio Neighbors A on: 11/10/2020 09:47 AM   Modules accepted: Orders

## 2020-11-12 ENCOUNTER — Ambulatory Visit (HOSPITAL_COMMUNITY): Payer: Medicaid Other

## 2020-11-12 ENCOUNTER — Encounter: Payer: Self-pay | Admitting: *Deleted

## 2020-11-12 ENCOUNTER — Ambulatory Visit (HOSPITAL_COMMUNITY): Admission: RE | Admit: 2020-11-12 | Payer: Medicaid Other | Source: Home / Self Care | Admitting: Orthopedic Surgery

## 2020-11-12 ENCOUNTER — Encounter (HOSPITAL_COMMUNITY): Payer: Medicaid Other

## 2020-11-12 ENCOUNTER — Encounter (HOSPITAL_COMMUNITY): Admission: RE | Payer: Self-pay | Source: Home / Self Care

## 2020-11-12 SURGERY — INSERTION, INTRAMEDULLARY ROD, HUMERUS
Anesthesia: Choice | Laterality: Left

## 2020-11-12 NOTE — Progress Notes (Signed)
Patient was scheduled to have surgery today with biopsy. This was cancelled due to patient.  Per Bethena Roys at Dr Bettina Gavia office, patient was initially offered Tuesday as a surgical date, but stated she needed Thursday. Surgery was scheduled for today, but when pre-admit called her to schedule her labs, she told them she wasn't ready to have surgery this quickly. Another pre-admit staff called her and patient stated she had told Dr Bettina Gavia office that she was cancelling surgery. Bethena Roys states they have received no communication from the patient, and she'd tried several times to reach patient, and left messages, without making contact.  Dr Marin Olp updated to the above.   1517: Called patient and message left on voicemail requesting call back.  Oncology Nurse Navigator Documentation  Oncology Nurse Navigator Flowsheets 11/12/2020  Abnormal Finding Date -  Diagnosis Status -  Navigator Follow Up Date: -  Navigator Follow Up Reason: -  Navigator Location [facility]  Referral Date to RadOnc/MedOnc -  Navigator Encounter Type Telephone;Appt/Treatment Plan Review  Telephone -  Treatment Phase Abnormal Scans  Barriers/Navigation Needs Coordination of Care;Education  Education -  Interventions Coordination of Care  Acuity Level 2-Minimal Needs (1-2 Barriers Identified)  Coordination of Care -  Education Method -  Support Groups/Services Friends and Family  Time Spent with Patient 30

## 2020-11-13 ENCOUNTER — Encounter: Payer: Self-pay | Admitting: *Deleted

## 2020-11-13 NOTE — Progress Notes (Signed)
Received MyChart communication from patient. She had a close contact covid exposure and felt poorly yesterday. She has not tested.She is sending a request for a refill on her oxycodone.  Explained to the patient that Dr Marin Olp is out of the office and we cannot address her request until Monday. She also states that she will be transferring care back to her old physicians at [facility], but doesn't have an appointment until 11/24/2020. She would like Dr Marin Olp to manage symptoms until then.   Patient also instructed to call [phone] to cancel her PET scan.    Will follow up with Dr Marin Olp on plan of care until 11/24/2020 on Monday.   Oncology Nurse Navigator Documentation  Oncology Nurse Navigator Flowsheets 11/13/2020  Abnormal Finding Date -  Diagnosis Status -  Navigator Follow Up Date: 11/16/2020  Navigator Follow Up Reason: Appointment Review  Navigator Location [facility]  Referral Date to RadOnc/MedOnc -  Navigator Encounter Type MyChart  Telephone -  Treatment Phase Abnormal Scans  Barriers/Navigation Needs Coordination of Care;Education  Education Other  Interventions Coordination of Care;Education;Medication Assistance;Psycho-Social Support  Acuity Level 2-Minimal Needs (1-2 Barriers Identified)  Coordination of Care -  Education Method Written  Support Groups/Services Friends and Family  Time Spent with Patient 30

## 2020-11-13 NOTE — Progress Notes (Signed)
Called patient to follow up. Unable to reach patient but left a voicemail requesting call back.  Also reminded patient of her PET scan on Monday. Appointment date, time, location and prep instructions left on voicemail.   Oncology Nurse Navigator Documentation  Oncology Nurse Navigator Flowsheets 11/13/2020  Abnormal Finding Date -  Diagnosis Status -  Navigator Follow Up Date: -  Navigator Follow Up Reason: -  Navigator Location [facility]  Referral Date to RadOnc/MedOnc -  Navigator Encounter Type Telephone  Telephone Education;Appt Confirmation/Clarification;Outgoing Call  Treatment Phase Abnormal Scans  Barriers/Navigation Needs Coordination of Care;Education  Education Other  Interventions Education  Acuity Level 2-Minimal Needs (1-2 Barriers Identified)  Coordination of Care -  Education Method Verbal  Support Groups/Services Friends and Family  Time Spent with Patient 15

## 2020-11-16 ENCOUNTER — Other Ambulatory Visit: Payer: Self-pay | Admitting: *Deleted

## 2020-11-16 ENCOUNTER — Encounter (HOSPITAL_COMMUNITY): Admission: RE | Admit: 2020-11-16 | Payer: Medicaid Other | Source: Ambulatory Visit

## 2020-11-16 ENCOUNTER — Encounter: Payer: Self-pay | Admitting: *Deleted

## 2020-11-16 DIAGNOSIS — C50919 Malignant neoplasm of unspecified site of unspecified female breast: Secondary | ICD-10-CM

## 2020-11-16 MED ORDER — OXYCODONE HCL 5 MG PO TABS: 5.0000 mg | ORAL_TABLET | ORAL | 0 refills | Status: AC | PRN

## 2020-11-16 NOTE — Progress Notes (Signed)
Spoke to Dr Marin Olp and he agrees to send in patient's pain medicine to cover her until she is seen by her previous physicians at [facility].   She is scheduled to have a PET scan this afternoon.   Called at 8:10a and left patient a message requesting a call back.   Patient called back at 11:45a. She confirmed that she will be transferring care to [facility]. She doesn't need any further follow up with this office. She is taking her oxycodone every 4h. Prescription refill request sent to Dr Marin Olp.   She did test positive for Covid and her PET has been cancelled.  Will discontinue navigation as she is seeking care elsewhere, however she knows that if anything changes, she can reach out to [place].   Oncology Nurse Navigator Documentation  Oncology Nurse Navigator Flowsheets 11/16/2020  Abnormal Finding Date -  Diagnosis Status -  Navigator Follow Up Date: -  Navigator Follow Up Reason: -  Navigation Complete Date: 11/16/2020  Post Navigation: Continue to Follow Patient? No  Reason Not Navigating Patient: Seeking Care elsewhere  Navigator Location [facility]  Referral Date to RadOnc/MedOnc -  Navigator Encounter Type Telephone  Telephone Appt Confirmation/Clarification;Asess Navigation Needs;Medication Assistance;Outgoing Call  Treatment Phase Abnormal Scans  Barriers/Navigation Needs Coordination of Care;Education  Education Other  Interventions Coordination of Care;Education;Medication Assistance;Psycho-Social Support  Acuity Level 2-Minimal Needs (1-2 Barriers Identified)  Coordination of Care Appts  Education Method Verbal  Support Groups/Services Friends and Family  Time Spent with Patient 11

## 2021-06-17 ENCOUNTER — Emergency Department (INDEPENDENT_AMBULATORY_CARE_PROVIDER_SITE_OTHER): Payer: Medicaid Other

## 2021-06-17 ENCOUNTER — Emergency Department (INDEPENDENT_AMBULATORY_CARE_PROVIDER_SITE_OTHER)
Admission: RE | Admit: 2021-06-17 | Discharge: 2021-06-17 | Disposition: A | Discharge status: Home / Self Care | Payer: Medicaid Other | Source: Ambulatory Visit

## 2021-06-17 ENCOUNTER — Other Ambulatory Visit: Payer: Self-pay

## 2021-06-17 VITALS — BP 129/85 | HR 108 | Temp 99.0°F | Resp 18 | Ht 62.0 in | Wt 135.0 lb

## 2021-06-17 DIAGNOSIS — M545 Low back pain, unspecified: Secondary | ICD-10-CM

## 2021-06-17 DIAGNOSIS — R109 Unspecified abdominal pain: Secondary | ICD-10-CM | POA: Diagnosis not present

## 2021-06-17 LAB — POCT URINALYSIS DIP (MANUAL ENTRY)
Bilirubin, UA: NEGATIVE
Blood, UA: NEGATIVE
Glucose, UA: NEGATIVE mg/dL
Ketones, POC UA: NEGATIVE mg/dL
Nitrite, UA: NEGATIVE
Protein Ur, POC: NEGATIVE mg/dL
Spec Grav, UA: <=1.005 — AB (ref 1.010–1.025)
Urobilinogen, UA: 0.2 E.U./dL
pH, UA: 5.5 (ref 5.0–8.0)

## 2021-06-17 NOTE — Discharge Instructions (Addendum)
Advised/informed patient of this morning's CT results with hard copy provided.  pain level.  Advised patient to go to [facility] ED NOW for further evaluation of today's scan including pain management as her PCP and Hematology/Oncology team are with [facility]. ?

## 2021-06-17 NOTE — ED Triage Notes (Signed)
Low back pain, right side x 2 weeks, worse 3 days.Fever. Denies urinary problems ?

## 2021-06-17 NOTE — ED Provider Notes (Signed)
?[place] ? ? ? ?CSN: [id number] ?Arrival date & time: 06/17/21  0849 ? ? ?  ? ?History   ?Chief Complaint ?Chief Complaint  ?Patient presents with  ? Back Pain  ?  Kidney pain - Entered by patient  ? ? ?HPI ?Jaclyn Taylor is a 46 y.o. female.  ? ?HPI 46 year old female presents with lower back pain right-sided for 2 weeks, worse over the past 3 days.  Patient endorses fever.  Patient denies urinary symptoms.  Patient currently reports pain as 10 of 10 and has taken 10 mg oxycodone immediate release prior to office visit.  PMH significant for breast cancer and chronic back pain.  ? ?Past Medical History:  ?Diagnosis Date  ? Breast cancer ([place])   ? Chronic back pain   ? COVID-19 02/2020  ? Goals of care, counseling/discussion 10/30/2020  ? 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 Metastatic breast cancer 10/30/2020  ? ? ?Patient Active Problem List  ? Diagnosis Date Noted  ? Metastatic breast cancer 10/30/2020  ? 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 Goals of care, counseling/discussion 10/30/2020  ? Periostitis ([place]) 10/28/2020  ? Lateral epicondylitis of right elbow 10/06/2020  ? Brachioradialis muscle tenderness 09/17/2020  ? 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 ? ?Past Surgical History:  ?Procedure Laterality Date  ? MASTECTOMY    ? TONSILLECTOMY AND ADENOIDECTOMY    ? ? ?OB History   ?No obstetric history on file. ?  ? ? ? ?Home Medications   ? ?Prior to Admission medications   ?Medication Sig Start Date End Date Taking? Authorizing Provider  ?tamoxifen (NOLVADEX) 10 MG tablet Take 10 mg by mouth 2 (two) times daily.   Yes [provider]  ?meloxicam (MOBIC) 7.5 MG tablet Take 1 tablet (7.5 mg total) by mouth 2 (two) times daily as needed. 10/28/20   Rosemarie Ax, MD  ?ondansetron (ZOFRAN) 4 MG tablet Take 1 tablet (4 mg total) by mouth every 8 (eight) hours as needed for nausea or vomiting. 10/12/20   Rosemarie Ax, MD  ?oxyCODONE (OXY IR/ROXICODONE) 5 MG immediate release  tablet Take 1 tablet (5 mg total) by mouth every 4 (four) hours as needed for severe pain. 11/16/20   Volanda Napoleon, MD  ? ? ?Family History ?Family History  ?Problem Relation Age of Onset  ? Cancer Mother   ?     breast and lung CA  ? Healthy Father   ? ? ?Social History ?Social History  ? ?Tobacco Use  ? Smoking status: Every Day  ? Smokeless tobacco: Never  ?Vaping Use  ? Vaping Use: Never used  ?Substance Use Topics  ? Alcohol use: Not Currently  ? Drug use: Never  ? ? ? ?Allergies   ?Tizanidine, Aleve [naproxen sodium], Doxycycline, and Acetaminophen ? ? ?Review of Systems ?Review of Systems  ?Genitourinary:  Positive for flank pain.  ? ? ?Physical Exam ?Triage Vital Signs ?ED Triage Vitals  ?Enc Vitals Group  ?   BP   ?   Pulse   ?   Resp   ?   Temp   ?   Temp src   ?   SpO2   ?   Weight   ?   Height   ?   Head Circumference   ?   Peak Flow   ?   Pain Score   ?   Pain Loc   ?   Pain Edu?   ?  Excl. in [place]?   ? ?No data found. ? ?Updated Vital Signs ?BP 129/85 (BP Location: Left Arm)   Pulse (!) 108   Temp 99 ?F (37.2 ?C) (Oral)   Resp 18   Ht '5\' 2"'$  (1.575 m)   Wt 135 lb (61.2 kg)   SpO2 99%   BMI 24.69 kg/m?  ? ?Physical Exam ?Vitals and nursing note reviewed.  ?Constitutional:   ?   Appearance: Normal appearance. She is normal weight.  ?HENT:  ?   Head: Normocephalic and atraumatic.  ?   Mouth/Throat:  ?   Mouth: Mucous membranes are moist.  ?   Pharynx: Oropharynx is clear.  ?Eyes:  ?   Extraocular Movements: Extraocular movements intact.  ?   Conjunctiva/sclera: Conjunctivae normal.  ?   Pupils: Pupils are equal, round, and reactive to light.  ?Cardiovascular:  ?   Rate and Rhythm: Normal rate and regular rhythm.  ?   Pulses: Normal pulses.  ?   Heart sounds: Normal heart sounds. No murmur heard. ?Pulmonary:  ?   Effort: Pulmonary effort is normal.  ?   Breath sounds: Normal breath sounds. No wheezing, rhonchi or rales.  ?Abdominal:  ?   Tenderness: There is right CVA tenderness.  ?Musculoskeletal:   ?   Cervical back: Normal range of motion and neck supple.  ?Skin: ?   General: Skin is warm and dry.  ?Neurological:  ?   General: No focal deficit present.  ?   Mental Status: She is alert and oriented to person, place, and time. Mental status is at baseline.  ? ? ? ?UC Treatments / Results  ?Labs ?(all labs ordered are listed, but only abnormal results are displayed) ?Labs Reviewed  ?POCT URINALYSIS DIP (MANUAL ENTRY) - Abnormal; Notable for the following components:  ?    Result Value  ? Clarity, UA cloudy (*)   ? Spec Grav, UA <=1.005 (*)   ? Leukocytes, UA Trace (*)   ? All other components within normal limits  ? ? ?EKG ? ? ?Radiology ?CT Renal Stone Study ? ?Result Date: 06/17/2021 ?CLINICAL DATA:  Right-sided low back pain for 2 weeks. EXAM: CT ABDOMEN AND PELVIS WITHOUT CONTRAST TECHNIQUE: Multidetector CT imaging of the abdomen and pelvis was performed following the standard protocol without IV contrast. RADIATION DOSE REDUCTION: This exam was performed according to the departmental dose-optimization program which includes automated exposure control, adjustment of the mA and/or kV according to patient size and/or use of iterative reconstruction technique. COMPARISON:  None. FINDINGS: Lower chest: No acute abnormality. Hepatobiliary: 2.6 cm hypodense, fluid attenuating left hepatic mass consistent with a cyst. Liver is otherwise unremarkable. No gallstones, gallbladder wall thickening, or biliary dilatation. Pancreas: Unremarkable. No pancreatic ductal dilatation or surrounding inflammatory changes. Spleen: Normal in size without focal abnormality. Adrenals/Urinary Tract: Adrenal glands are unremarkable. No renal mass. No urolithiasis. Mild left pelviectasis. Otherwise no obstructive uropathy. Stomach/Bowel: Stomach is within normal limits. No evidence of bowel wall thickening, distention, or inflammatory changes. Appendix is normal. Vascular/Lymphatic: Normal caliber abdominal aorta with mild  atherosclerosis. No lymphadenopathy. Reproductive: Uterus and bilateral adnexa are unremarkable. Fluid in the endometrial cavity. Other: No abdominal wall hernia or abnormality. No abdominopelvic ascites. Musculoskeletal: No acute osseous abnormality. No aggressive osseous lesion. 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 Subcentimeter sclerotic bone lesions in the left ilium, right ischium, bilateral proximal femurs, L3 vertebral body, L4 vertebral body, L5 vertebral body most concerning for metastatic disease. Severe degenerative disease with disc height  loss and a broad-based disc bulge at L5-S1. Sclerosis in the left femoral head concerning for metastatic disease with a subtle linear sclerotic component concerning for a nondisplaced fracture. IMPRESSION: 1. Mild left pelviectasis.  No urolithiasis. 2. Sclerosis in the left femoral head concerning for metastatic disease with a subtle linear sclerotic component concerning for a nondisplaced fracture. If there is further clinical concern, recommend an MRI of the left hip. 3. Multiple sclerotic subcentimeter bone lesions in the left ilium, right ischium, bilateral proximal femurs, L3 vertebral body, L4 vertebral body, L5 vertebral body most concerning for metastatic disease. 4. Aortic Atherosclerosis (ICD10-I70.0). Electronically Signed   By: Kathreen Devoid M.D.   On: 06/17/2021 10:07   ? ?Procedures ?Procedures (including critical care time) ? ?Medications Ordered in UC ?Medications - No data to display ? ?Initial Impression / Assessment and Plan / UC Course  ?I have reviewed the triage vital signs and the nursing notes. ? ?Pertinent labs & imaging results that were available during my care of the patient were reviewed by me and considered in my medical decision making (see chart for details). ? ?  ? ?MDM: 1.  Right flank pain-advised/informed patient of CT results above with hard copy provided. Advised/informed patient of this morning's CT results with hard copy  provided. Advised/informed patient of this morning's CT results with hard copy provided.  Advised patient to go to [facility] ED NOW for further evaluation of today's scan including pain management a

## 2022-03-30 IMAGING — DX DG SHOULDER 2+V*R*
3 series · 3 of 3 positions shown · non-contrast
Comparison: October 06, 2020.

CLINICAL DATA: Abnormal bone lesion.

EXAM:
RIGHT SHOULDER - 2+ VIEW

[shoulder grashey]
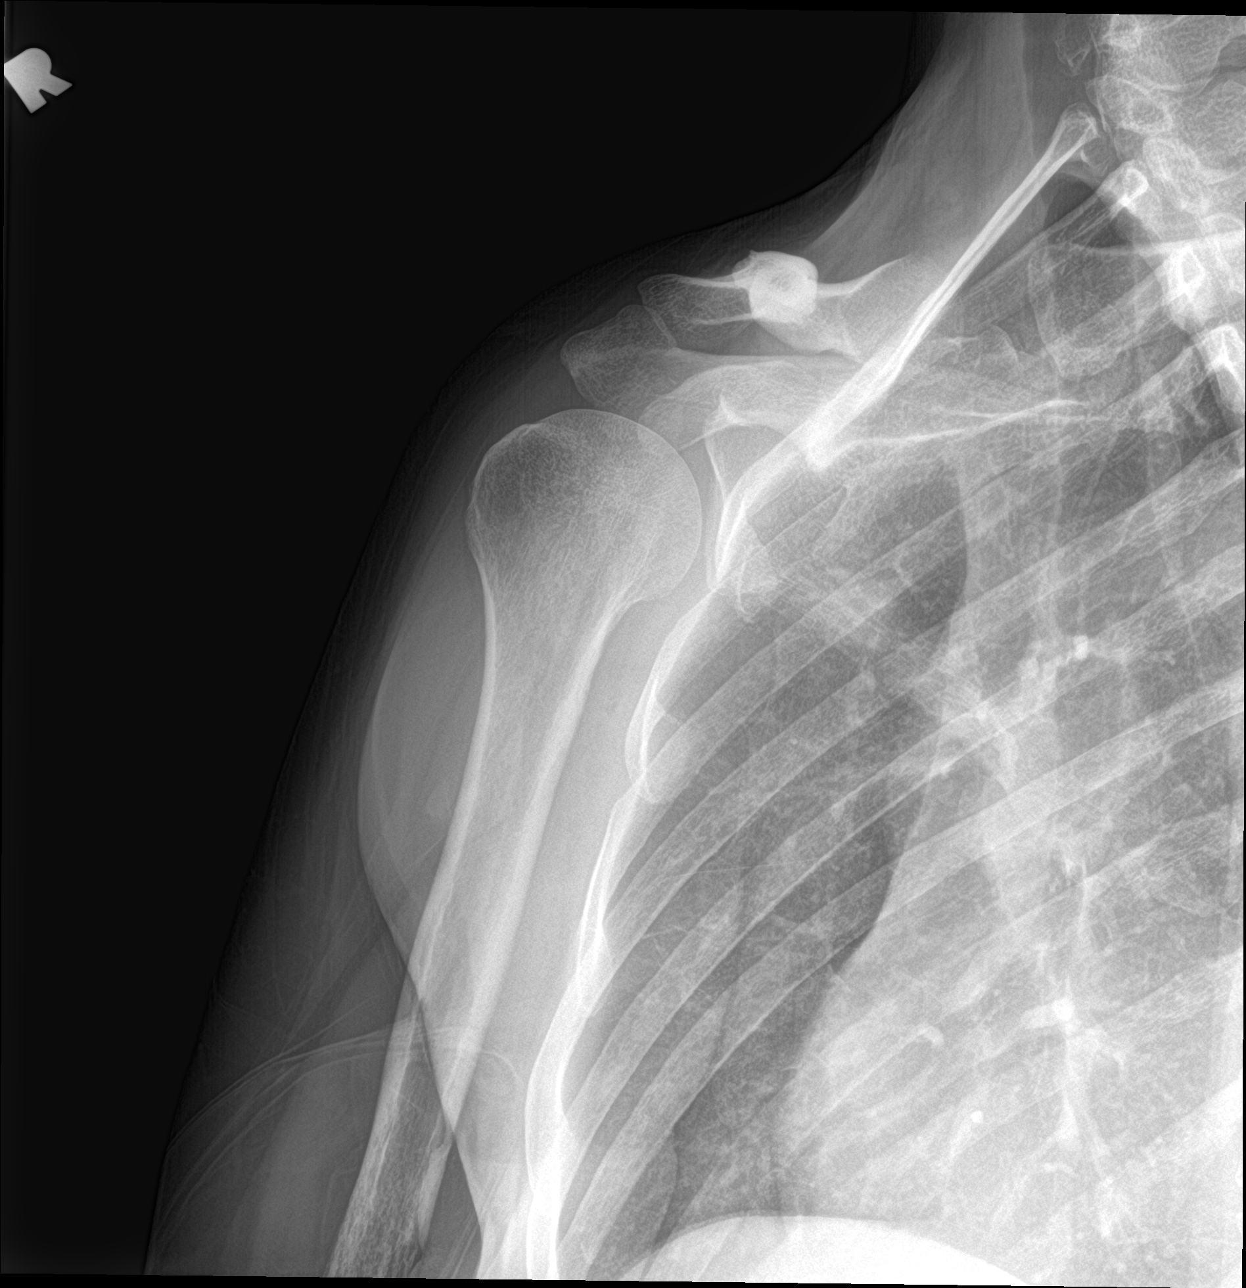

[shoulder y view]
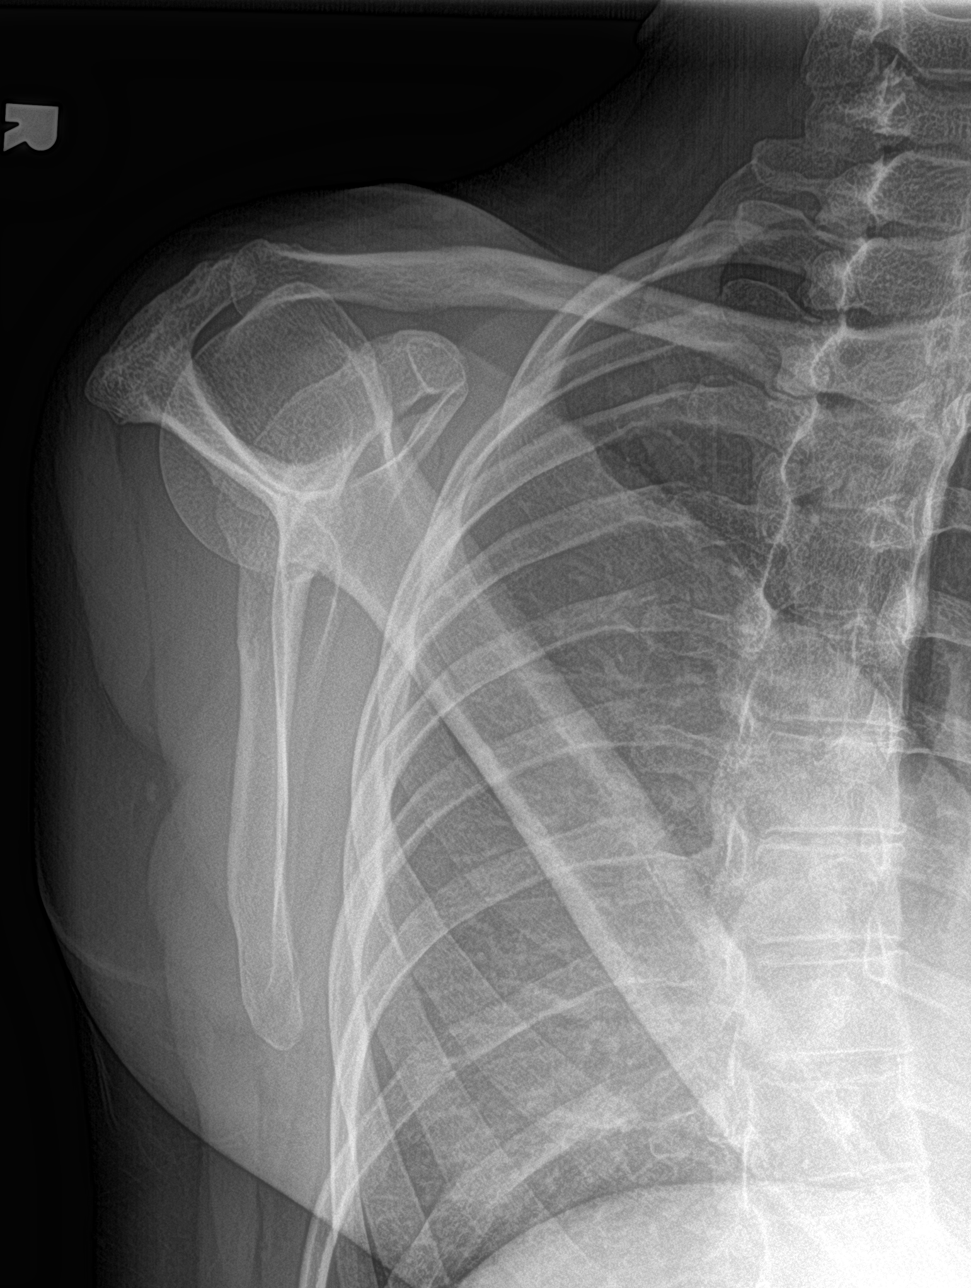

[shoulder axillary]
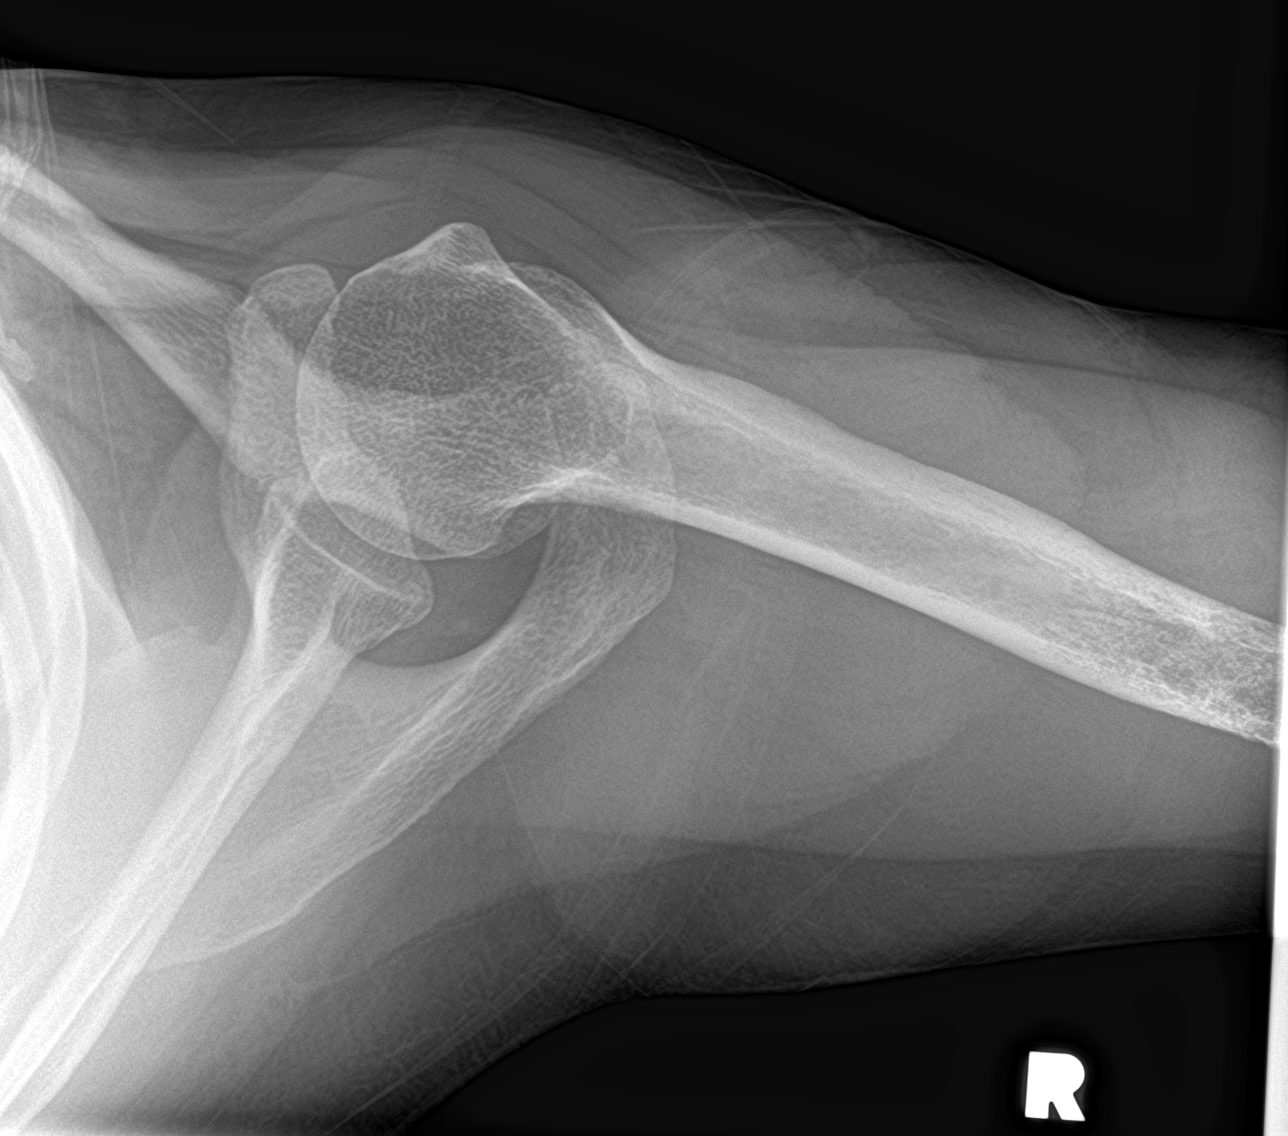

[3 of 3 positions shown; findings below may reference images not displayed]

FINDINGS: There is no evidence of fracture or dislocation. There is no
evidence of arthropathy. Probable lytic lesion involving distal
humeral shaft is only partially visualized on this radiograph. Soft
tissues are unremarkable.
IMPRESSION: Right shoulder is unremarkable. Probable lytic lesion seen involving
distal humeral shaft noted on prior exam is only partly visualized
on this radiograph. This is concerning for malignancy or metastatic
disease, and further evaluation with MRI is recommended.

## 2022-06-06 ENCOUNTER — Encounter: Payer: Self-pay | Admitting: *Deleted

## 2022-12-07 IMAGING — CT CT RENAL STONE PROTOCOL
2 of 4 series · 16 of 46 positions shown, 18 images · non-contrast
Comparison: None.

CLINICAL DATA: Right-sided low back pain for 2 weeks.



[Series 2: axial st · axial · 0.67mm/px · z∈[-450,-66]mm · 13 of 85 slices shown, 15 images]
[im 4/85  soft-tissue]
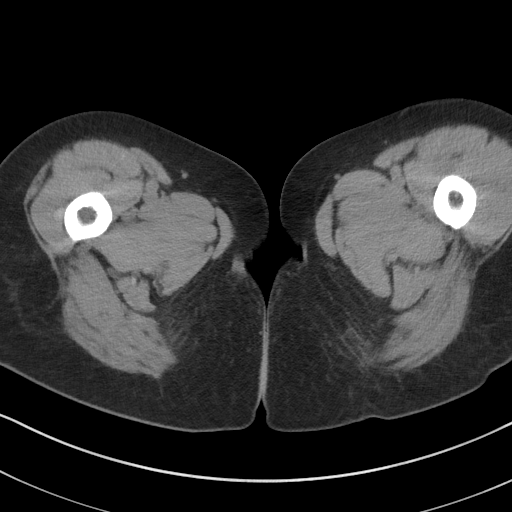
[im 4/85  bone]
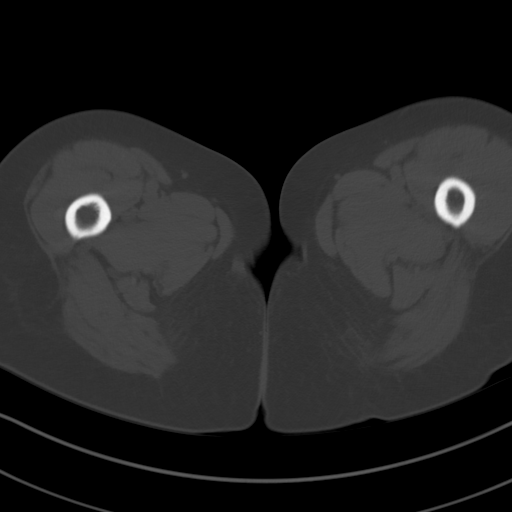
[im 10/85  soft-tissue]
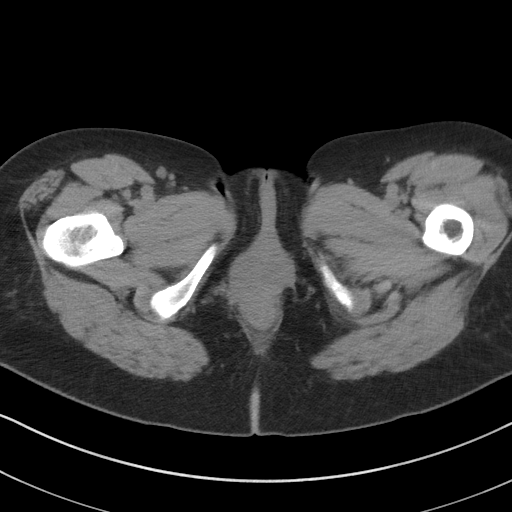
[im 17/85  soft-tissue]
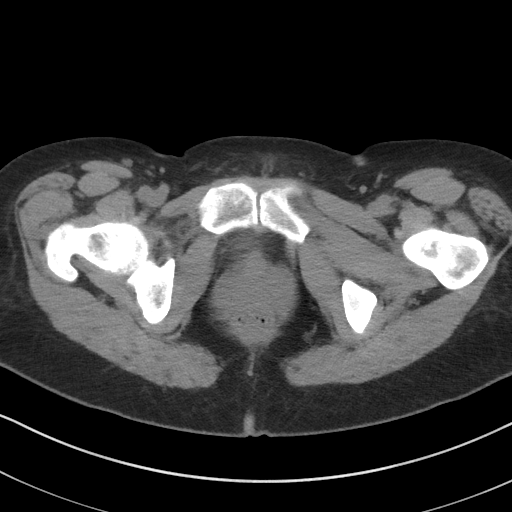
[im 23/85  soft-tissue]
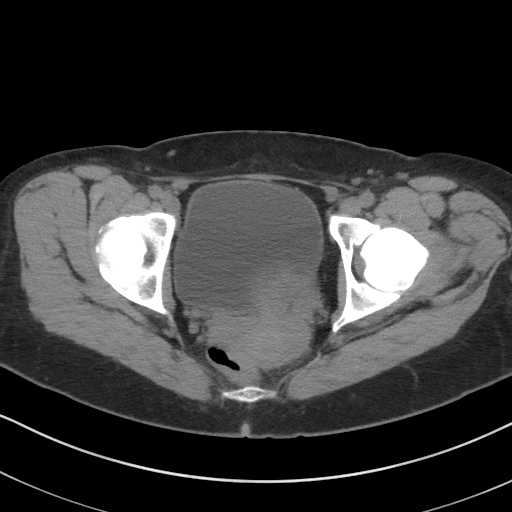
[im 30/85  soft-tissue]
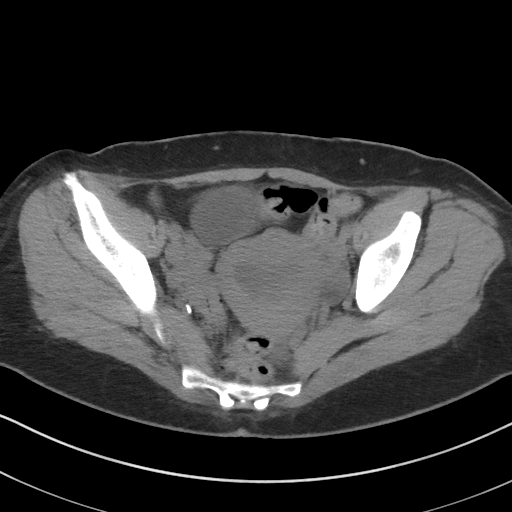
[im 36/85  soft-tissue]
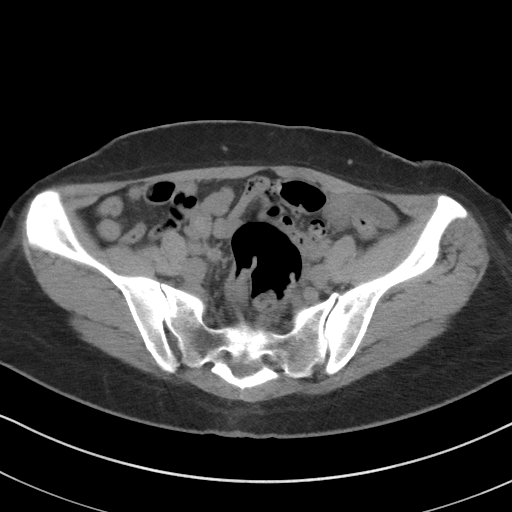
[im 43/85  soft-tissue]
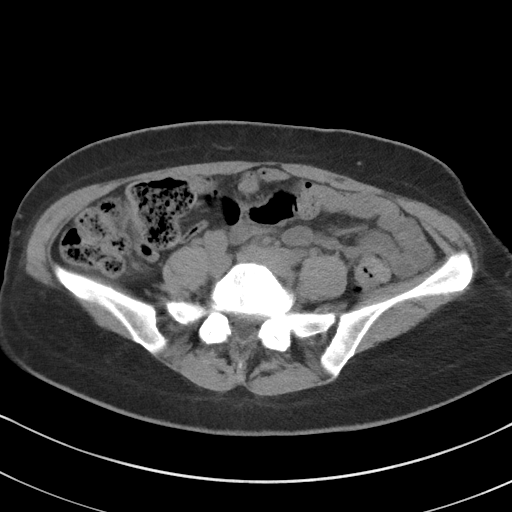
[im 49/85  soft-tissue]
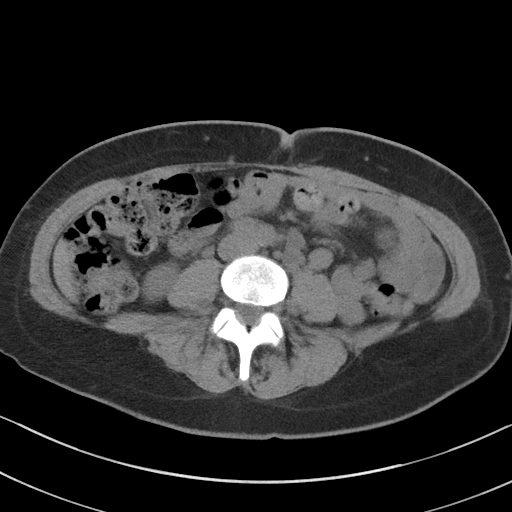
[im 55/85  soft-tissue]
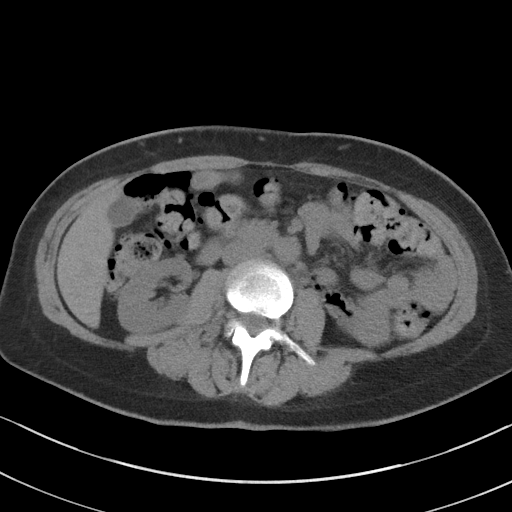
[im 55/85  bone]
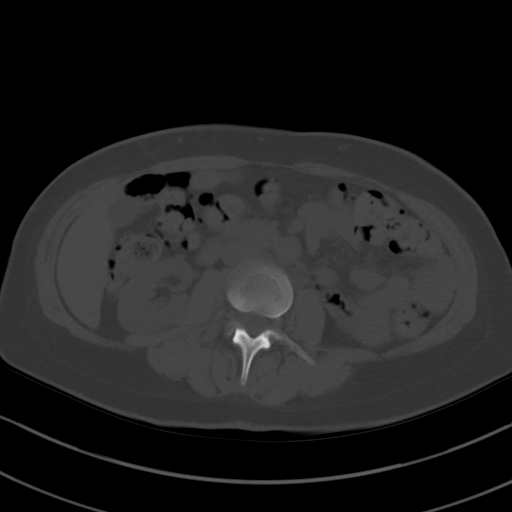
[im 62/85  soft-tissue]
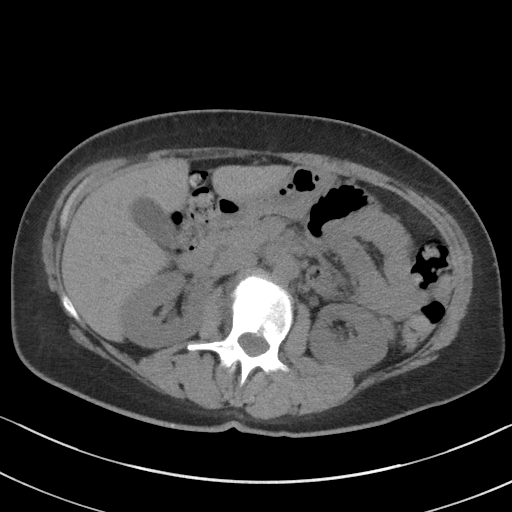
[im 68/85  soft-tissue]
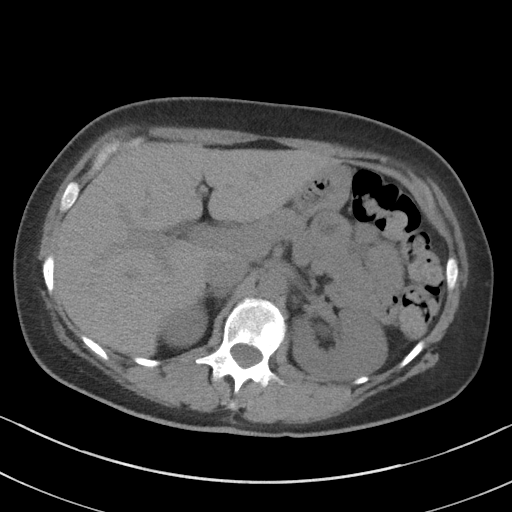
[im 75/85  soft-tissue]
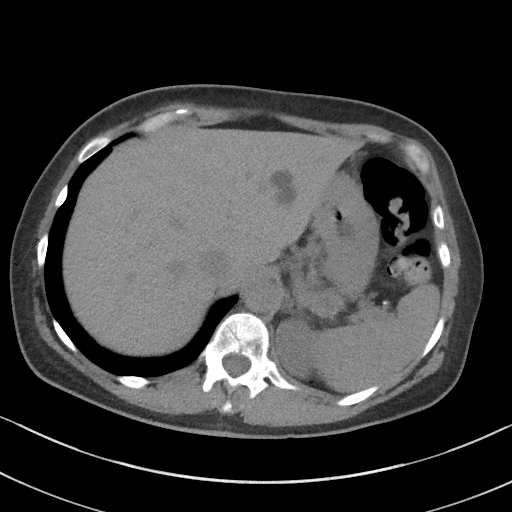
[im 81/85  soft-tissue]
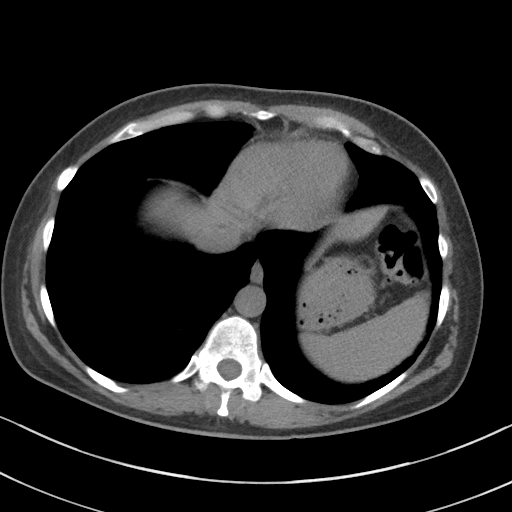

[Series 4: coronal st · coronal · 0.70mm/px · 3 of 76 slices shown]
[im 26/76  soft-tissue]
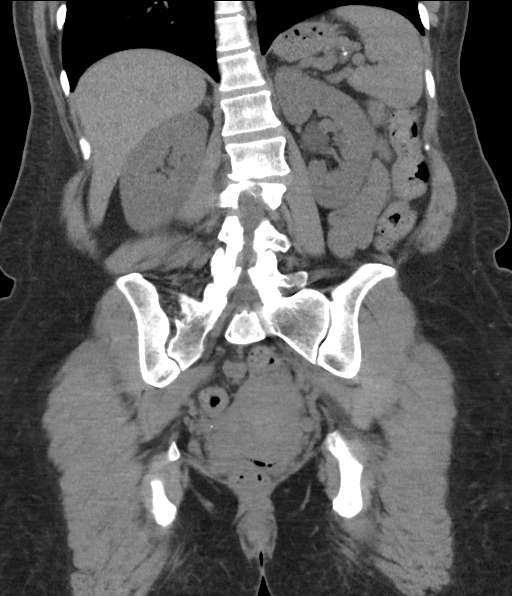
[im 34/76  soft-tissue]
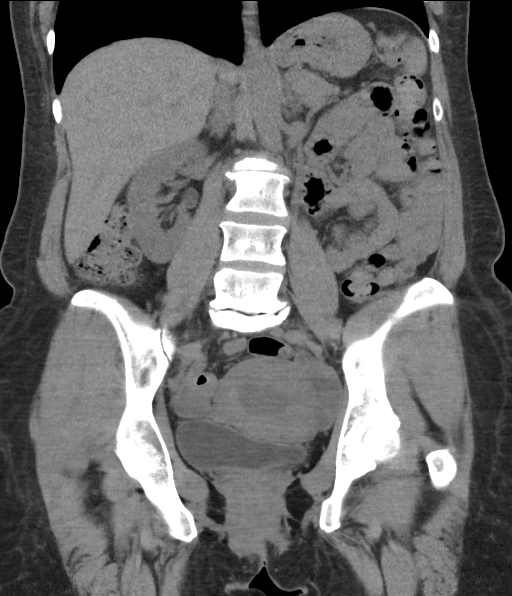
[im 42/76  soft-tissue]
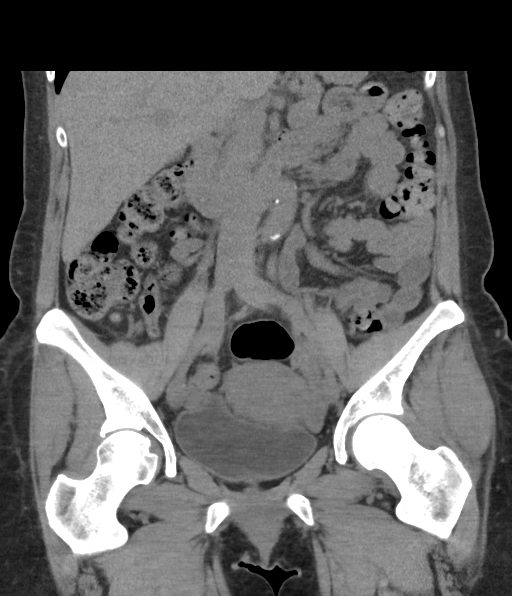

[16 of 46 positions shown; findings below may reference images not displayed]

FINDINGS: Lower chest: No acute abnormality.

Hepatobiliary: 2.6 cm hypodense, fluid attenuating left hepatic mass
consistent with a cyst. Liver is otherwise unremarkable. No
gallstones, gallbladder wall thickening, or biliary dilatation.

Pancreas: Unremarkable. No pancreatic ductal dilatation or
surrounding inflammatory changes.

Spleen: Normal in size without focal abnormality.

Adrenals/Urinary Tract: Adrenal glands are unremarkable. No renal
mass. No urolithiasis. Mild left pelviectasis. Otherwise no
obstructive uropathy.

Stomach/Bowel: Stomach is within normal limits. No evidence of bowel
wall thickening, distention, or inflammatory changes. Appendix is
normal.

Vascular/Lymphatic: Normal caliber abdominal aorta with mild
atherosclerosis. No lymphadenopathy.

Reproductive: Uterus and bilateral adnexa are unremarkable. Fluid in
the endometrial cavity.

Other: No abdominal wall hernia or abnormality. No abdominopelvic
ascites.

Musculoskeletal: No acute osseous abnormality. No aggressive osseous
lesion. Mild osteoarthritis of the left SI joint. Subcentimeter
sclerotic bone lesions in the left ilium, right ischium, bilateral
proximal femurs, L3 vertebral body, L4 vertebral body, L5 vertebral
body most concerning for metastatic disease. Severe degenerative
disease with disc height loss and a broad-based disc bulge at L5-S1.
Sclerosis in the left femoral head concerning for metastatic disease
with a subtle linear sclerotic component concerning for a
nondisplaced fracture.
IMPRESSION: 1. Mild left pelviectasis.  No urolithiasis.
2. Sclerosis in the left femoral head concerning for metastatic
disease with a subtle linear sclerotic component concerning for a
nondisplaced fracture. If there is further clinical concern,
recommend an MRI of the left hip.
3. Multiple sclerotic subcentimeter bone lesions in the left ilium,
right ischium, bilateral proximal femurs, L3 vertebral body, L4
vertebral body, L5 vertebral body most concerning for metastatic
disease.
4. Aortic Atherosclerosis ([id number]).
